# Patient Record
Sex: Female | Born: 1990 | Race: Black or African American | Hispanic: No | Marital: Single | Smoking: Never smoker
Health system: Southern US, Community
[De-identification: ages and names within clinical notes are randomized; demographics above are authoritative.]

## PROBLEM LIST (undated history)

## (undated) ENCOUNTER — Inpatient Hospital Stay (HOSPITAL_COMMUNITY): Payer: Self-pay

## (undated) DIAGNOSIS — K219 Gastro-esophageal reflux disease without esophagitis: Secondary | ICD-10-CM

## (undated) DIAGNOSIS — M549 Dorsalgia, unspecified: Secondary | ICD-10-CM

## (undated) HISTORY — PX: NO PAST SURGERIES: SHX2092

---

## 2011-07-20 ENCOUNTER — Encounter (HOSPITAL_COMMUNITY): Payer: Self-pay | Admitting: *Deleted

## 2011-07-20 ENCOUNTER — Inpatient Hospital Stay (HOSPITAL_COMMUNITY)
Admission: AD | Admit: 2011-07-20 | Discharge: 2011-07-20 | Disposition: A | Discharge status: Home / Self Care | Payer: Medicaid Other | Source: Ambulatory Visit | Attending: Obstetrics & Gynecology | Admitting: Obstetrics & Gynecology

## 2011-07-20 ENCOUNTER — Inpatient Hospital Stay (HOSPITAL_COMMUNITY): Payer: Medicaid Other

## 2011-07-20 DIAGNOSIS — Z3400 Encounter for supervision of normal first pregnancy, unspecified trimester: Secondary | ICD-10-CM

## 2011-07-20 DIAGNOSIS — O26819 Pregnancy related exhaustion and fatigue, unspecified trimester: Secondary | ICD-10-CM

## 2011-07-20 DIAGNOSIS — O26849 Uterine size-date discrepancy, unspecified trimester: Secondary | ICD-10-CM | POA: Insufficient documentation

## 2011-07-20 DIAGNOSIS — O26841 Uterine size-date discrepancy, first trimester: Secondary | ICD-10-CM

## 2011-07-20 DIAGNOSIS — IMO0002 Reserved for concepts with insufficient information to code with codable children: Secondary | ICD-10-CM | POA: Insufficient documentation

## 2011-07-20 LAB — URINALYSIS, ROUTINE W REFLEX MICROSCOPIC
Leukocytes, UA: NEGATIVE
Nitrite: NEGATIVE
Specific Gravity, Urine: 1.025 (ref 1.005–1.030)
pH: 6 (ref 5.0–8.0)

## 2011-07-20 LAB — CBC
Platelets: 214 10*3/uL (ref 150–400)
RBC: 4.01 MIL/uL (ref 3.87–5.11)
RDW: 13 % (ref 11.5–15.5)
WBC: 7.1 10*3/uL (ref 4.0–10.5)

## 2011-07-20 LAB — GLUCOSE, CAPILLARY: Glucose-Capillary: 83 mg/dL (ref 70–99)

## 2011-07-20 MED ORDER — ACETAMINOPHEN 500 MG PO TABS
1000.0000 mg | ORAL_TABLET | Freq: Once | ORAL | Status: AC
  Administered 2011-07-20: 1000 mg via ORAL
  Filled 2011-07-20: qty 2

## 2011-07-20 NOTE — MAU Note (Signed)
[employer] translator [place] used for triage. Patient state she has been feeling mild pain all over her body and is tired. Has had a positive pregnancy test at [facility]. Has an appointment with [facility] next week. Denies any bleeding or unusual discharge, abdominal pain or nausea.

## 2011-07-20 NOTE — Discharge Instructions (Signed)
Eat small frequent meals/snacks. Drink 10-12 glasses of water per day. Take naps as needed.

## 2011-07-20 NOTE — MAU Provider Note (Signed)
Cindy Traore20 y.o.G1P0 @[redacted]w[redacted]d  by LMP Chief Complaint  Patient presents with  . Fatigue     First Provider Initiated Contact with Patient 07/20/11 1812      SUBJECTIVE  HPI: Pt reports weakness, fatigue, HA, body aches x a few days. She has not tried anything for Tx. She denies fever, chills, neck stiffness, N/V/D, loss of appetite, VB, abd pain. She states she is scheduled to start [facility] next week.   Past Medical History  Diagnosis Date  . No pertinent past medical history    Past Surgical History  Procedure Date  . No past surgeries    History   Social History  . Marital Status: Married    Spouse Name: N/A    Number of Children: N/A  . Years of Education: N/A   Occupational History  . Not on file.   Social History Main Topics  . Smoking status: Never Smoker   . Smokeless tobacco: Never Used  . Alcohol Use: No  . Drug Use: No  . Sexually Active: Yes    Birth Control/ Protection: None   Other Topics Concern  . Not on file   Social History Narrative  . No narrative on file   No current facility-administered medications on file prior to encounter.   No current outpatient prescriptions on file prior to encounter.   No Known Allergies  ROS: Pertinent items in HPI  OBJECTIVE Blood pressure 110/68, pulse 79, temperature 98.8 F (37.1 C), temperature source Oral, resp. rate 16, height 5' 3.5" (1.613 m), weight 59.149 kg (130 lb 6.4 oz), last menstrual period 05/18/2011, SpO2 100.00%.  GENERAL: Well-developed, well-nourished female in no acute distress.  HEENT: Normocephalic. Mucus membranes moist. HEART: normal rate RESP: normal effort ABDOMEN: Soft, nontender EXTREMITIES: Nontender, no edema NEURO: Alert and oriented SPECULUM EXAM:deferred UTA FHR by informal BS [place].   LAB RESULTS Results for orders placed during the hospital encounter of 07/20/11 (from the past 24 hour(s))  URINALYSIS, ROUTINE W REFLEX MICROSCOPIC     Status: Normal   Collection Time   07/20/11  5:05 PM      Component Value Range   Color, Urine YELLOW  YELLOW    APPearance CLEAR  CLEAR    Specific Gravity, Urine 1.025  1.005 - 1.030    pH 6.0  5.0 - 8.0    Glucose, UA NEGATIVE  NEGATIVE (mg/dL)   Hgb urine dipstick NEGATIVE  NEGATIVE    Bilirubin Urine NEGATIVE  NEGATIVE    Ketones, ur NEGATIVE  NEGATIVE (mg/dL)   Protein, ur NEGATIVE  NEGATIVE (mg/dL)   Urobilinogen, UA 0.2  0.0 - 1.0 (mg/dL)   Nitrite NEGATIVE  NEGATIVE    Leukocytes, UA NEGATIVE  NEGATIVE   POCT PREGNANCY, URINE     Status: Abnormal   Collection Time   07/20/11  5:20 PM      Component Value Range   Preg Test, Ur POSITIVE (*) NEGATIVE   GLUCOSE, CAPILLARY     Status: Normal   Collection Time   07/20/11  6:29 PM      Component Value Range   Glucose-Capillary 83  70 - 99 (mg/dL)   Comment 1 Notify RN    CBC     Status: Normal   Collection Time   07/20/11  6:33 PM      Component Value Range   WBC 7.1  4.0 - 10.5 (K/uL)   RBC 4.01  3.87 - 5.11 (MIL/uL)   Hemoglobin 12.5  12.0 - 15.0 (g/dL)   HCT 16.1  09.6 - 04.5 (%)   MCV 92.5  78.0 - 100.0 (fL)   MCH 31.2  26.0 - 34.0 (pg)   MCHC 33.7  30.0 - 36.0 (g/dL)   RDW 40.9  81.1 - 91.4 (%)   Platelets 214  150 - 400 (K/uL)   HA resolved w/ Tylenol and PO fluids.  IMAGING 5.6 week SIUP, S<D, FHR 101.   ASSESSMENT 1. Uterine size date discrepancy pregnancy, first trimester   2. Normal pregnancy, first   3. Fatigue during pregnancy    PLAN D/C home Follow-up Information    Follow up with [facility] ob/gyn. (next week or MAU as needed if symptoms worsen)         Medication List  As of 07/20/2011  8:16 PM   CONTINUE taking these medications         prenatal multivitamin Tabs          Discussed normal fatigue of pregnancy. Encouraged naps and frequent snacks.   Dorathy Kinsman 07/20/2011 6:44 PM

## 2011-07-23 NOTE — MAU Provider Note (Signed)
Attestation of Attending Supervision of Advanced Practitioner: Evaluation and management procedures were performed by the [facility] Fellow/PA/CNM/NP under my supervision and collaboration. Chart reviewed, and agree with management and plan.  Jaynie Collins, M.D. 07/23/2011 10:59 AM

## 2011-08-05 ENCOUNTER — Encounter (HOSPITAL_COMMUNITY): Payer: Self-pay | Admitting: *Deleted

## 2011-08-05 ENCOUNTER — Inpatient Hospital Stay (EMERGENCY_DEPARTMENT_HOSPITAL)
Admission: AD | Admit: 2011-08-05 | Discharge: 2011-08-06 | Disposition: A | Discharge status: Home / Self Care | Payer: Medicaid Other | Source: Ambulatory Visit | Attending: Obstetrics and Gynecology | Admitting: Obstetrics and Gynecology

## 2011-08-05 DIAGNOSIS — K5900 Constipation, unspecified: Secondary | ICD-10-CM

## 2011-08-05 DIAGNOSIS — O210 Mild hyperemesis gravidarum: Secondary | ICD-10-CM | POA: Insufficient documentation

## 2011-08-05 DIAGNOSIS — R1013 Epigastric pain: Secondary | ICD-10-CM | POA: Insufficient documentation

## 2011-08-05 DIAGNOSIS — O99891 Other specified diseases and conditions complicating pregnancy: Secondary | ICD-10-CM | POA: Insufficient documentation

## 2011-08-05 DIAGNOSIS — K5909 Other constipation: Secondary | ICD-10-CM

## 2011-08-05 DIAGNOSIS — R12 Heartburn: Secondary | ICD-10-CM | POA: Insufficient documentation

## 2011-08-05 DIAGNOSIS — R109 Unspecified abdominal pain: Secondary | ICD-10-CM | POA: Insufficient documentation

## 2011-08-05 LAB — URINALYSIS, ROUTINE W REFLEX MICROSCOPIC
Hgb urine dipstick: NEGATIVE
Ketones, ur: 40 mg/dL — AB
Leukocytes, UA: NEGATIVE
Protein, ur: NEGATIVE mg/dL
Urobilinogen, UA: 0.2 mg/dL (ref 0.0–1.0)

## 2011-08-05 MED ORDER — FLEET ENEMA 7-19 GM/118ML RE ENEM
1.0000 | ENEMA | Freq: Once | RECTAL | Status: AC
  Administered 2011-08-05: 1 via RECTAL

## 2011-08-05 MED ORDER — POLYETHYLENE GLYCOL 3350 17 GM/SCOOP PO POWD: 17.0000 g | Freq: Every day | ORAL | Status: AC

## 2011-08-05 MED ORDER — CLOTRIMAZOLE 1 % VA CREA: 1.0000 | TOPICAL_CREAM | Freq: Every day | VAGINAL | Status: DC

## 2011-08-05 NOTE — MAU Note (Signed)
C/o soreness over her entire abdomen (mostly at night); c/o nausea frequently; c/o indigestion frequently; does not speak any [place]; has lived here only 4 months; and is here with her husband;

## 2011-08-05 NOTE — MAU Note (Signed)
Per pt's husband pt is having generalized abd pain every night x 5 nights, taking tylenol without relief. Denies bleeding. Denies dysuria. Vomiting every night. Taking pepcid for heartburn

## 2011-08-05 NOTE — MAU Provider Note (Signed)
History   CSN: [id number] Arrival date and time: 08/05/11 2228  First Provider Initiated Contact with Patient 08/05/11 2317   Chief Complaint  Patient presents with  . Abdominal Pain   Abdominal Pain This is a new problem. The current episode started in the past 7 days. The onset quality is gradual. The problem occurs constantly. The most recent episode lasted 5 days. The problem has been gradually worsening. The pain is located in the generalized abdominal region and suprapubic region. The pain is at a severity of 7/10. The pain is moderate. The quality of the pain is aching, colicky and cramping. The abdominal pain does not radiate. Associated symptoms include anorexia, headaches (occasionally, no associated symptoms, non currently) and nausea. Pertinent negatives include no arthralgias, belching, constipation (reports, daily very soft stools, no straining), diarrhea, dysuria, fever, flatus, frequency, hematochezia, hematuria, melena, myalgias, vomiting or weight loss. The pain is aggravated by nothing. The pain is relieved by nothing. She has tried H2 blockers for the symptoms. The treatment provided no relief. There is no history of abdominal surgery, colon cancer, Crohn's disease, gallstones, GERD, irritable bowel syndrome, pancreatitis, PUD or ulcerative colitis.  Patient is a 21 y.o. female presenting with abdominal pain.  Abdominal Pain The primary symptoms of the illness include abdominal pain and nausea. The primary symptoms of the illness do not include fever, vomiting, diarrhea, hematochezia or dysuria. The current episode started in the past 7 days. The onset of the illness was gradual. The problem has been gradually worsening.  The abdominal pain does not radiate.  Additional symptoms associated with the illness include anorexia. Symptoms associated with the illness do not include chills, constipation (reports, daily very soft stools, no straining), hematuria or frequency. Significant  associated medical issues do not include PUD, GERD or gallstones.    OB History    Grav Para Term Preterm Abortions TAB SAB Ect Mult Living   1               Past Medical History  Diagnosis Date  . No pertinent past medical history     Past Surgical History  Procedure Date  . No past surgeries     Family History  Problem Relation Age of Onset  . Anesthesia problems Neg Hx   . Hypotension Neg Hx   . Malignant hyperthermia Neg Hx   . Pseudochol deficiency Neg Hx    History  Substance Use Topics  . Smoking status: Never Smoker   . Smokeless tobacco: Never Used  . Alcohol Use: No   Allergies: No Known Allergies  Prescriptions prior to admission  Medication Sig Dispense Refill  . Prenatal Vit-Fe Fumarate-FA (PRENATAL MULTIVITAMIN) TABS Take 1 tablet by mouth daily.       Review of Systems  Constitutional: Negative for fever, chills and weight loss.  Eyes: Negative for blurred vision, double vision and photophobia.  Respiratory: Negative.   Cardiovascular: Negative.   Gastrointestinal: Positive for nausea, abdominal pain and anorexia. Negative for vomiting, diarrhea, constipation (reports, daily very soft stools, no straining), melena, hematochezia and flatus.  Genitourinary: Negative.  Negative for dysuria, frequency and hematuria.  Musculoskeletal: Negative for myalgias and arthralgias.  Neurological: Positive for headaches (occasionally, no associated symptoms, non currently).  Psychiatric/Behavioral: Negative.    Physical Exam   Blood pressure 117/76, pulse 76, temperature 99.2 F (37.3 C), temperature source Oral, resp. rate 16, height 5\' 3"  (1.6 m), weight 57.607 kg (127 lb), last menstrual period 05/18/2011, SpO2 100.00%.  Physical Exam  Nursing note and vitals reviewed. Constitutional: She is oriented to person, place, and time. She appears well-developed and well-nourished. No distress.  HENT:  Head: Normocephalic and atraumatic.  Eyes: Conjunctivae are  normal. Right eye exhibits no discharge. Left eye exhibits no discharge. No scleral icterus.  Neck: Neck supple.  Cardiovascular: Normal rate, regular rhythm, normal heart sounds and intact distal pulses.  Exam reveals no gallop and no friction rub.   No murmur heard. Respiratory: Effort normal and breath sounds normal. No respiratory distress. She has no wheezes. She has no rales. She exhibits no tenderness.  GI: Soft. Bowel sounds are normal. She exhibits mass (palpable stool burden). She exhibits no distension. There is tenderness. There is no rebound and no guarding.  Genitourinary:       Deferred  Musculoskeletal: Normal range of motion. She exhibits no edema and no tenderness.  Neurological: She is alert and oriented to person, place, and time. She has normal reflexes.  Skin: Skin is warm and dry. She is not diaphoretic.  Psychiatric: She has a normal mood and affect. Her behavior is normal.    MAU Course  Procedures Results for orders placed during the hospital encounter of 08/05/11 (from the past 24 hour(s))  URINALYSIS, ROUTINE W REFLEX MICROSCOPIC     Status: Abnormal   Collection Time   08/05/11 10:45 PM      Component Value Range   Color, Urine YELLOW  YELLOW    APPearance CLEAR  CLEAR    Specific Gravity, Urine 1.015  1.005 - 1.030    pH 6.0  5.0 - 8.0    Glucose, UA NEGATIVE  NEGATIVE (mg/dL)   Hgb urine dipstick NEGATIVE  NEGATIVE    Bilirubin Urine NEGATIVE  NEGATIVE    Ketones, ur 40 (*) NEGATIVE (mg/dL)   Protein, ur NEGATIVE  NEGATIVE (mg/dL)   Urobilinogen, UA 0.2  0.0 - 1.0 (mg/dL)   Nitrite NEGATIVE  NEGATIVE    Leukocytes, UA NEGATIVE  NEGATIVE   CBC     Status: Normal   Collection Time   08/06/11 12:05 AM      Component Value Range   WBC 6.2  4.0 - 10.5 (K/uL)   RBC 4.36  3.87 - 5.11 (MIL/uL)   Hemoglobin 13.6  12.0 - 15.0 (g/dL)   HCT 16.1  09.6 - 04.5 (%)   MCV 92.2  78.0 - 100.0 (fL)   MCH 31.2  26.0 - 34.0 (pg)   MCHC 33.8  30.0 - 36.0 (g/dL)   RDW  40.9  81.1 - 91.4 (%)   Platelets 191  150 - 400 (K/uL)   MDM Pt with history consistent with chronic constipation worsened by pregnancy.  No bleeding or other concerning symptoms. CBC unremarkable; no fevers, no chills  Assessment and Plan  Chronic constipation. Leading to nausea and poor po intake.  Fleets enema in MAU with expected results.  Miralax q day at home for tx and prevention as course expected to worsen throughout the remainder of pregnancy.    Case discussed with and pt seen by Kerrie Buffalo, NP.  Andrena Mews, DO [facility] Resident - PGY-1 08/06/2011 12:42 AM  Patient had good results with enema.  I have examined this patient and agree with the assessment and plan of care.

## 2011-08-06 ENCOUNTER — Inpatient Hospital Stay (HOSPITAL_COMMUNITY): Payer: Medicaid Other

## 2011-08-06 ENCOUNTER — Encounter (HOSPITAL_COMMUNITY): Payer: Self-pay | Admitting: *Deleted

## 2011-08-06 ENCOUNTER — Inpatient Hospital Stay (HOSPITAL_COMMUNITY)
Admission: AD | Admit: 2011-08-06 | Discharge: 2011-08-06 | Disposition: A | Discharge status: Home / Self Care | Payer: Medicaid Other | Source: Ambulatory Visit | Attending: Obstetrics & Gynecology | Admitting: Obstetrics & Gynecology

## 2011-08-06 DIAGNOSIS — O26891 Other specified pregnancy related conditions, first trimester: Secondary | ICD-10-CM

## 2011-08-06 DIAGNOSIS — O9989 Other specified diseases and conditions complicating pregnancy, childbirth and the puerperium: Secondary | ICD-10-CM

## 2011-08-06 DIAGNOSIS — R12 Heartburn: Secondary | ICD-10-CM

## 2011-08-06 LAB — CBC
HCT: 39.4 % (ref 36.0–46.0)
MCHC: 33.8 g/dL (ref 30.0–36.0)
MCHC: 34.3 g/dL (ref 30.0–36.0)
MCV: 91.4 fL (ref 78.0–100.0)
MCV: 92.2 fL (ref 78.0–100.0)
Platelets: 191 10*3/uL (ref 150–400)
Platelets: 213 10*3/uL (ref 150–400)
RDW: 12.6 % (ref 11.5–15.5)
RDW: 12.8 % (ref 11.5–15.5)
WBC: 6.2 10*3/uL (ref 4.0–10.5)
WBC: 6.3 10*3/uL (ref 4.0–10.5)

## 2011-08-06 LAB — URINALYSIS, ROUTINE W REFLEX MICROSCOPIC
Glucose, UA: NEGATIVE mg/dL
Hgb urine dipstick: NEGATIVE
Leukocytes, UA: NEGATIVE
Protein, ur: NEGATIVE mg/dL
Specific Gravity, Urine: 1.02 (ref 1.005–1.030)
pH: 6.5 (ref 5.0–8.0)

## 2011-08-06 LAB — COMPREHENSIVE METABOLIC PANEL
AST: 19 U/L (ref 0–37)
Albumin: 4.5 g/dL (ref 3.5–5.2)
BUN: 5 mg/dL — ABNORMAL LOW (ref 6–23)
Calcium: 9.7 mg/dL (ref 8.4–10.5)
Chloride: 99 mEq/L (ref 96–112)
Creatinine, Ser: 0.43 mg/dL — ABNORMAL LOW (ref 0.50–1.10)
Total Bilirubin: 0.9 mg/dL (ref 0.3–1.2)
Total Protein: 7.6 g/dL (ref 6.0–8.3)

## 2011-08-06 MED ORDER — PROMETHAZINE HCL 25 MG RE SUPP: 25.0000 mg | Freq: Four times a day (QID) | RECTAL | Status: DC | PRN

## 2011-08-06 MED ORDER — PROMETHAZINE HCL 25 MG RE SUPP
25.0000 mg | Freq: Once | RECTAL | Status: AC
  Administered 2011-08-06: 25 mg via RECTAL
  Filled 2011-08-06: qty 1

## 2011-08-06 MED ORDER — GI COCKTAIL ~~LOC~~
30.0000 mL | Freq: Three times a day (TID) | ORAL | Status: DC | PRN
  Administered 2011-08-06 (×2): 30 mL via ORAL
  Filled 2011-08-06 (×2): qty 30

## 2011-08-06 MED ORDER — PANTOPRAZOLE SODIUM 20 MG PO TBEC: 20.0000 mg | DELAYED_RELEASE_TABLET | Freq: Two times a day (BID) | ORAL | Status: DC

## 2011-08-06 MED ORDER — ONDANSETRON 8 MG PO TBDP
8.0000 mg | ORAL_TABLET | Freq: Once | ORAL | Status: AC
  Administered 2011-08-06: 8 mg via ORAL
  Filled 2011-08-06: qty 1

## 2011-08-06 NOTE — MAU Provider Note (Signed)
Agree with above note.  [name] 08/06/2011 6:02 AM

## 2011-08-06 NOTE — MAU Note (Signed)
Patient states she has been having upper abdominal pain since this weekend. Hurts more with eating and trying to swallow her medication. Has some vomiting off and on. Was seen last night in MAU. States pain continues.

## 2011-08-06 NOTE — MAU Provider Note (Addendum)
Cindy Traore21 y.o.G1P0 @[redacted]w[redacted]d  by LMP Chief Complaint  Patient presents with  . Abdominal Pain     First Provider Initiated Contact with Patient 08/06/11 1740      SUBJECTIVE HPI: [name] is a 21 y.o. year old G1P0 female at [redacted]w[redacted]d weeks gestation who presents to MAU reporting worsening epigastric pain, N/V w/ ~3 episodes of emesis per day. She was seen in MAU yesterday for similar complaints which were attributed to constiption. She reports worsening of Sx w/ eating. She experiences mild relief w/ pepcid, but does not have any antiemetics at home. She denies fever, chills, pain radiating to her back, VB or low abd pain.   Past Medical History  Diagnosis Date  . No pertinent past medical history    Past Surgical History  Procedure Date  . No past surgeries    History   Social History  . Marital Status: Married    Spouse Name: N/A    Number of Children: N/A  . Years of Education: N/A   Occupational History  . Not on file.   Social History Main Topics  . Smoking status: Never Smoker   . Smokeless tobacco: Never Used  . Alcohol Use: No  . Drug Use: No  . Sexually Active: Yes    Birth Control/ Protection: None   Other Topics Concern  . Not on file   Social History Narrative  . No narrative on file   Current Facility-Administered Medications on File Prior to Encounter  Medication Dose Route Frequency Provider Last Rate Last Dose  . sodium phosphate (FLEET) 7-19 GM/118ML enema 1 enema  1 enema Rectal Once Andrena Mews, DO   1 enema at 08/05/11 2358   Current Outpatient Prescriptions on File Prior to Encounter  Medication Sig Dispense Refill  . famotidine (PEPCID) 10 MG tablet Take 10 mg by mouth 2 (two) times daily as needed.      . Prenatal Vit-Fe Fumarate-FA (PRENATAL MULTIVITAMIN) TABS Take 1 tablet by mouth at bedtime.       . polyethylene glycol powder (MIRALAX) powder Take 17 g by mouth daily.  527 g  3   No Known Allergies  ROS: Pertinent items in  HPI  OBJECTIVE Blood pressure 115/70, pulse 78, temperature 99.1 F (37.3 C), temperature source Oral, resp. rate 16, last menstrual period 05/18/2011, SpO2 100.00%.  GENERAL: Well-developed, well-nourished female in no acute distress.  HEENT: Normocephalic HEART: normal rate RESP: normal effort ABDOMEN: Soft, mild epigastric tenderness. No mass EXTREMITIES: Nontender, no edema NEURO: Alert and oriented SPECULUM EXAM: Deferred FHR: 150 per informal BS [place].  LAB RESULTS Results for orders placed during the hospital encounter of 08/06/11 (from the past 24 hour(s))  LIPASE, BLOOD     Status: Normal   Collection Time   08/06/11  5:45 PM      Component Value Range   Lipase 25  11 - 59 (U/L)  AMYLASE     Status: Normal   Collection Time   08/06/11  5:45 PM      Component Value Range   Amylase 87  0 - 105 (U/L)  CBC     Status: Normal   Collection Time   08/06/11  5:45 PM      Component Value Range   WBC 6.3  4.0 - 10.5 (K/uL)   RBC 4.31  3.87 - 5.11 (MIL/uL)   Hemoglobin 13.5  12.0 - 15.0 (g/dL)   HCT 16.1  09.6 - 04.5 (%)   MCV 91.4  78.0 - 100.0 (fL)   MCH 31.3  26.0 - 34.0 (pg)   MCHC 34.3  30.0 - 36.0 (g/dL)   RDW 08.6  57.8 - 46.9 (%)   Platelets 213  150 - 400 (K/uL)  COMPREHENSIVE METABOLIC PANEL     Status: Abnormal   Collection Time   08/06/11  5:45 PM      Component Value Range   Sodium 132 (*) 135 - 145 (mEq/L)   Potassium 3.8  3.5 - 5.1 (mEq/L)   Chloride 99  96 - 112 (mEq/L)   CO2 24  19 - 32 (mEq/L)   Glucose, Bld 85  70 - 99 (mg/dL)   BUN 5 (*) 6 - 23 (mg/dL)   Creatinine, Ser 6.29 (*) 0.50 - 1.10 (mg/dL)   Calcium 9.7  8.4 - 52.8 (mg/dL)   Total Protein 7.6  6.0 - 8.3 (g/dL)   Albumin 4.5  3.5 - 5.2 (g/dL)   AST 19  0 - 37 (U/L)   ALT 11  0 - 35 (U/L)   Alkaline Phosphatase 54  39 - 117 (U/L)   Total Bilirubin 0.9  0.3 - 1.2 (mg/dL)   GFR calc non Af Amer >90  >90 (mL/min)   GFR calc Af Amer >90  >90 (mL/min)    IMAGING  Care of pt turned over to  Candelaria Celeste, MD at 2000.  [place], [place] 08/06/2011 8:15 PM   ASSESSMENT   PLAN   Assumed care.  Abdominal US normal.  Patient given Phenergan 25mg  suppository and GI cocktail with relief.    1.  Heartburn in pregnancy Patient to be sent home with Protonix and phenergan suppositories.  Patient to follow up if patient increases.  Levie Heritage, DO 08/06/2011 11:08 PM

## 2011-08-06 NOTE — Discharge Instructions (Signed)
Heartburn During Pregnancy  Heartburn is a burning sensation in the chest caused by stomach acid backing up into the esophagus. Heartburn (also known as "reflux") is common in pregnancy because a certain hormone (progesterone) changes. The progesterone hormone may relax the valve that separates the esophagus from the stomach. This allows acid to go up into the esophagus, causing heartburn. Heartburn may also happen in pregnancy because the enlarging uterus pushes up on the stomach, which pushes more acid into the esophagus. This is especially true in the later stages of pregnancy. Heartburn problems usually go away after giving birth. CAUSES   The progesterone hormone.   Changing hormone levels.   The growing uterus that pushes stomach acid upward.   Large meals.   Certain foods and drinks.   Exercise.   Increased acid production.  SYMPTOMS   Burning pain in the chest or lower throat.   Bitter taste in the mouth.   Coughing.  DIAGNOSIS  Heartburn is typically diagnosed by your caregiver when taking a careful history of your concern. Your caregiver may order a blood test to check for a certain type of bacteria that is associated with heartburn. Sometimes, heartburn is diagnosed by prescribing a heartburn medicine to see if the symptoms improve. It is rare in pregnancy to have a procedure called an endoscopy. This is when a tube with a light and a camera on the end is used to examine the esophagus and the stomach. TREATMENT   Your caregiver may tell you to use certain over-the-counter medicines (antacids, acid reducers) for mild heartburn.   Your caregiver may prescribe medicines to decrease stomach acid or to protect your stomach lining.   Your caregiver may recommend certain diet changes.   For severe cases, your caregiver may recommend that the head of the bed be elevated on blocks. (Sleeping with more pillows is not an effective treatment as it only changes the position of your  head and does not improve the main problem of stomach acid refluxing into the esophagus.)  HOME CARE INSTRUCTIONS   Take all medicines as directed by your caregiver.   Raise the head of your bed by putting blocks under the legs if instructed to by your caregiver.   Do not exercise right after eating.   Avoid eating 2 or 3 hours before bed. Do not lie down right after eating.   Eat small meals throughout the day instead of 3 large meals.   Identify foods and beverages that make your symptoms worse and avoid them. Foods you may want to avoid include:   Peppers.   Chocolate.   High-fat foods, including fried foods.   Spicy foods.   Garlic and onions.   Citrus fruits, including oranges, grapefruit, lemons, and limes.   Food containing tomatoes or tomato products.   Mint.   Carbonated and caffeinated drinks.   Vinegar.  SEEK IMMEDIATE MEDICAL CARE IF:   You have severe chest pain that goes down your arm or into your jaw or neck.   You feel sweaty, dizzy, or lightheaded.   You become short of breath.   You vomit blood.   You have difficulty or pain with swallowing.   You have bloody or black, tarry stools.   You have episodes of heartburn more than 3 times a week, for more than 2 weeks.  MAKE SURE YOU:  Understand these instructions.   Will watch your condition.   Will get help right away if you are not doing well or   get worse.  Document Released: 02/17/2000 Document Revised: 02/08/2011 Document Reviewed: 08/10/2010 [employer] Patient Information 2012 [employer]. 

## 2011-08-06 NOTE — Discharge Instructions (Signed)
Please follow up with your OB as scheduled next week.  Be sure to drink plenty of water and eat a diet high in fiber.    Constipation in Adults Constipation is having fewer than 2 bowel movements per week. Usually, the stools are hard. As we grow older, constipation is more common. If you try to fix constipation with laxatives, the problem may get worse. This is because laxatives taken over a long period of time make the colon muscles weaker. A low-fiber diet, not taking in enough fluids, and taking some medicines may make these problems worse. MEDICATIONS THAT MAY CAUSE CONSTIPATION  Water pills (diuretics).   Calcium channel blockers (used to control blood pressure and for the heart).   Certain pain medicines (narcotics).   Anticholinergics.   Anti-inflammatory agents.   Antacids that contain aluminum.  DISEASES THAT CONTRIBUTE TO CONSTIPATION  Diabetes.   Parkinson's disease.   Dementia.   Stroke.   Depression.   Illnesses that cause problems with salt and water metabolism.  HOME CARE INSTRUCTIONS   Constipation is usually best cared for without medicines. Increasing dietary fiber and eating more fruits and vegetables is the best way to manage constipation.   Slowly increase fiber intake to 25 to 38 grams per day. Whole grains, fruits, vegetables, and legumes are good sources of fiber. A dietitian can further help you incorporate high-fiber foods into your diet.   Drink enough water and fluids to keep your urine clear or pale yellow.   A fiber supplement may be added to your diet if you cannot get enough fiber from foods.   Increasing your activities also helps improve regularity.   Suppositories, as suggested by your caregiver, will also help. If you are using antacids, such as aluminum or calcium containing products, it will be helpful to switch to products containing magnesium if your caregiver says it is okay.   If you have been given a liquid injection (enema)  today, this is only a temporary measure. It should not be relied on for treatment of longstanding (chronic) constipation.   Stronger measures, such as magnesium sulfate, should be avoided if possible. This may cause uncontrollable diarrhea. Using magnesium sulfate may not allow you time to make it to the bathroom.  SEEK IMMEDIATE MEDICAL CARE IF:   There is bright red blood in the stool.   The constipation stays for more than 4 days.   There is belly (abdominal) or rectal pain.   You do not seem to be getting better.   You have any questions or concerns.  MAKE SURE YOU:   Understand these instructions.   Will watch your condition.   Will get help right away if you are not doing well or get worse.  Document Released: 11/18/2003 Document Revised: 02/08/2011 Document Reviewed: 01/23/2011 [facility] Patient Information 2012 [place], [place].

## 2011-08-06 NOTE — MAU Note (Signed)
Pt reports good results from fleets enema.

## 2011-08-06 NOTE — MAU Provider Note (Signed)
Attestation of Attending Supervision of Advanced Practitioner: Evaluation and management procedures were performed by the OB Fellow/PA/CNM/NP under my supervision and collaboration. Chart reviewed, and agree with management and plan.  Milano Rosevear, M.D. 08/06/2011 10:54 PM   

## 2011-08-15 LAB — OB RESULTS CONSOLE GC/CHLAMYDIA
Chlamydia: NEGATIVE
Gonorrhea: NEGATIVE

## 2011-08-15 LAB — OB RESULTS CONSOLE ABO/RH

## 2011-08-15 LAB — OB RESULTS CONSOLE ANTIBODY SCREEN: Antibody Screen: NEGATIVE

## 2011-11-15 LAB — OB RESULTS CONSOLE GBS: GBS: NEGATIVE

## 2012-03-05 NOTE — L&D Delivery Note (Signed)
Delivery Note Pt progressed to complete and pushed well.  At 8:21 PM a viable female was delivered via Vaginal, Spontaneous Delivery (Presentation: Right Occiput Anterior).  APGAR: 8, ; weight 6 lb 15.3 oz (3155 g).   Placenta status: Intact, Spontaneous.  Cord: 3 vessels with the following complications: None.  Anesthesia: Epidural  Episiotomy: Median Lacerations: None Suture Repair: 3.0 vicryl rapide Est. Blood Loss (mL): 400  Mom to postpartum.  Baby to stay with mom.  Manuel Lawhead D 03/13/2012, 9:21 PM

## 2012-03-13 ENCOUNTER — Inpatient Hospital Stay (HOSPITAL_COMMUNITY)
Admission: AD | Admit: 2012-03-13 | Discharge: 2012-03-13 | Disposition: A | Discharge status: Hospice | Payer: Medicaid Other | Source: Ambulatory Visit | Attending: Obstetrics and Gynecology | Admitting: Obstetrics and Gynecology

## 2012-03-13 ENCOUNTER — Encounter (HOSPITAL_COMMUNITY): Payer: Self-pay | Admitting: Anesthesiology

## 2012-03-13 ENCOUNTER — Encounter (HOSPITAL_COMMUNITY): Payer: Self-pay | Admitting: *Deleted

## 2012-03-13 ENCOUNTER — Inpatient Hospital Stay (HOSPITAL_COMMUNITY): Payer: Medicaid Other | Admitting: Anesthesiology

## 2012-03-13 ENCOUNTER — Inpatient Hospital Stay (HOSPITAL_COMMUNITY)
Admission: AD | Admit: 2012-03-13 | Discharge: 2012-03-15 | DRG: 775 | Disposition: A | Discharge status: Home / Self Care | Payer: Medicaid Other | Source: Ambulatory Visit | Attending: Obstetrics and Gynecology | Admitting: Obstetrics and Gynecology

## 2012-03-13 DIAGNOSIS — O479 False labor, unspecified: Secondary | ICD-10-CM | POA: Insufficient documentation

## 2012-03-13 DIAGNOSIS — IMO0001 Reserved for inherently not codable concepts without codable children: Secondary | ICD-10-CM

## 2012-03-13 HISTORY — DX: Gastro-esophageal reflux disease without esophagitis: K21.9

## 2012-03-13 LAB — CBC
Platelets: 183 10*3/uL (ref 150–400)
RBC: 4.07 MIL/uL (ref 3.87–5.11)
RDW: 12.4 % (ref 11.5–15.5)
WBC: 10 10*3/uL (ref 4.0–10.5)

## 2012-03-13 LAB — RPR: RPR Ser Ql: NONREACTIVE

## 2012-03-13 MED ORDER — DIPHENHYDRAMINE HCL 50 MG/ML IJ SOLN: 12.5000 mg | INTRAMUSCULAR | Status: DC | PRN

## 2012-03-13 MED ORDER — BENZOCAINE-MENTHOL 20-0.5 % EX AERO
1.0000 "application " | INHALATION_SPRAY | CUTANEOUS | Status: DC | PRN
  Administered 2012-03-13: 1 via TOPICAL
  Filled 2012-03-13: qty 56

## 2012-03-13 MED ORDER — EPHEDRINE 5 MG/ML INJ
10.0000 mg | INTRAVENOUS | Status: DC | PRN
  Filled 2012-03-13: qty 4

## 2012-03-13 MED ORDER — FENTANYL 2.5 MCG/ML BUPIVACAINE 1/10 % EPIDURAL INFUSION (WH - ANES)
14.0000 mL/h | INTRAMUSCULAR | Status: DC
  Administered 2012-03-13: 14 mL/h via EPIDURAL
  Filled 2012-03-13: qty 125

## 2012-03-13 MED ORDER — ZOLPIDEM TARTRATE 5 MG PO TABS
5.0000 mg | ORAL_TABLET | Freq: Once | ORAL | Status: AC
  Administered 2012-03-13: 5 mg via ORAL
  Filled 2012-03-13: qty 1

## 2012-03-13 MED ORDER — DIBUCAINE 1 % RE OINT: 1.0000 "application " | TOPICAL_OINTMENT | RECTAL | Status: DC | PRN

## 2012-03-13 MED ORDER — MEASLES, MUMPS & RUBELLA VAC ~~LOC~~ INJ: 0.5000 mL | INJECTION | Freq: Once | SUBCUTANEOUS | Status: DC

## 2012-03-13 MED ORDER — LIDOCAINE HCL (PF) 1 % IJ SOLN
30.0000 mL | INTRAMUSCULAR | Status: DC | PRN
  Filled 2012-03-13: qty 30

## 2012-03-13 MED ORDER — METHYLERGONOVINE MALEATE 0.2 MG/ML IJ SOLN: 0.2000 mg | INTRAMUSCULAR | Status: DC | PRN

## 2012-03-13 MED ORDER — LACTATED RINGERS IV SOLN: 500.0000 mL | INTRAVENOUS | Status: DC | PRN

## 2012-03-13 MED ORDER — CITRIC ACID-SODIUM CITRATE 334-500 MG/5ML PO SOLN: 30.0000 mL | ORAL | Status: DC | PRN

## 2012-03-13 MED ORDER — ZOLPIDEM TARTRATE 5 MG PO TABS: 5.0000 mg | ORAL_TABLET | Freq: Every evening | ORAL | Status: DC | PRN

## 2012-03-13 MED ORDER — LIDOCAINE HCL (PF) 1 % IJ SOLN
INTRAMUSCULAR | Status: DC | PRN
  Administered 2012-03-13 (×4): 4 mL

## 2012-03-13 MED ORDER — ACETAMINOPHEN 325 MG PO TABS: 650.0000 mg | ORAL_TABLET | ORAL | Status: DC | PRN

## 2012-03-13 MED ORDER — PHENYLEPHRINE 40 MCG/ML (10ML) SYRINGE FOR IV PUSH (FOR BLOOD PRESSURE SUPPORT): 80.0000 ug | PREFILLED_SYRINGE | INTRAVENOUS | Status: DC | PRN

## 2012-03-13 MED ORDER — ONDANSETRON HCL 4 MG/2ML IJ SOLN: 4.0000 mg | INTRAMUSCULAR | Status: DC | PRN

## 2012-03-13 MED ORDER — METHYLERGONOVINE MALEATE 0.2 MG PO TABS: 0.2000 mg | ORAL_TABLET | ORAL | Status: DC | PRN

## 2012-03-13 MED ORDER — LANOLIN HYDROUS EX OINT: TOPICAL_OINTMENT | CUTANEOUS | Status: DC | PRN

## 2012-03-13 MED ORDER — IBUPROFEN 600 MG PO TABS: 600.0000 mg | ORAL_TABLET | Freq: Four times a day (QID) | ORAL | Status: DC | PRN

## 2012-03-13 MED ORDER — OXYCODONE-ACETAMINOPHEN 5-325 MG PO TABS: 1.0000 | ORAL_TABLET | ORAL | Status: DC | PRN

## 2012-03-13 MED ORDER — OXYTOCIN 40 UNITS IN LACTATED RINGERS INFUSION - SIMPLE MED
62.5000 mL/h | INTRAVENOUS | Status: DC
  Filled 2012-03-13: qty 1000

## 2012-03-13 MED ORDER — LACTATED RINGERS IV SOLN
INTRAVENOUS | Status: DC
  Administered 2012-03-13 (×2): via INTRAVENOUS

## 2012-03-13 MED ORDER — WITCH HAZEL-GLYCERIN EX PADS: 1.0000 "application " | MEDICATED_PAD | CUTANEOUS | Status: DC | PRN

## 2012-03-13 MED ORDER — TETANUS-DIPHTH-ACELL PERTUSSIS 5-2.5-18.5 LF-MCG/0.5 IM SUSP: 0.5000 mL | Freq: Once | INTRAMUSCULAR | Status: DC

## 2012-03-13 MED ORDER — PHENYLEPHRINE 40 MCG/ML (10ML) SYRINGE FOR IV PUSH (FOR BLOOD PRESSURE SUPPORT)
80.0000 ug | PREFILLED_SYRINGE | INTRAVENOUS | Status: DC | PRN
  Filled 2012-03-13: qty 5

## 2012-03-13 MED ORDER — BUTORPHANOL TARTRATE 1 MG/ML IJ SOLN: 1.0000 mg | INTRAMUSCULAR | Status: DC | PRN

## 2012-03-13 MED ORDER — DIPHENHYDRAMINE HCL 25 MG PO CAPS: 25.0000 mg | ORAL_CAPSULE | Freq: Four times a day (QID) | ORAL | Status: DC | PRN

## 2012-03-13 MED ORDER — OXYTOCIN BOLUS FROM INFUSION
500.0000 mL | INTRAVENOUS | Status: DC
  Administered 2012-03-13: 500 mL via INTRAVENOUS

## 2012-03-13 MED ORDER — ONDANSETRON HCL 4 MG PO TABS: 4.0000 mg | ORAL_TABLET | ORAL | Status: DC | PRN

## 2012-03-13 MED ORDER — LACTATED RINGERS IV SOLN: 500.0000 mL | Freq: Once | INTRAVENOUS | Status: DC

## 2012-03-13 MED ORDER — MAGNESIUM HYDROXIDE 400 MG/5ML PO SUSP: 30.0000 mL | ORAL | Status: DC | PRN

## 2012-03-13 MED ORDER — SIMETHICONE 80 MG PO CHEW: 80.0000 mg | CHEWABLE_TABLET | ORAL | Status: DC | PRN

## 2012-03-13 MED ORDER — IBUPROFEN 600 MG PO TABS
600.0000 mg | ORAL_TABLET | Freq: Four times a day (QID) | ORAL | Status: DC
  Administered 2012-03-13 – 2012-03-15 (×7): 600 mg via ORAL
  Filled 2012-03-13 (×6): qty 1

## 2012-03-13 MED ORDER — PRENATAL MULTIVITAMIN CH
1.0000 | ORAL_TABLET | Freq: Every day | ORAL | Status: DC
  Administered 2012-03-14 – 2012-03-15 (×2): 1 via ORAL
  Filled 2012-03-13 (×2): qty 1

## 2012-03-13 MED ORDER — EPHEDRINE 5 MG/ML INJ: 10.0000 mg | INTRAVENOUS | Status: DC | PRN

## 2012-03-13 MED ORDER — ONDANSETRON HCL 4 MG/2ML IJ SOLN: 4.0000 mg | Freq: Four times a day (QID) | INTRAMUSCULAR | Status: DC | PRN

## 2012-03-13 MED ORDER — SENNOSIDES-DOCUSATE SODIUM 8.6-50 MG PO TABS
2.0000 | ORAL_TABLET | Freq: Every day | ORAL | Status: DC
  Administered 2012-03-15: 2 via ORAL

## 2012-03-13 NOTE — MAU Note (Signed)
Pt states U/C got more intense since leaving 1100 this am from MAU and are every 5 minutes.  FOB states pt took tylenol pm without relief.  Pt. Denies vaginal bleeding or ROM.  Good fetal movement.

## 2012-03-13 NOTE — H&P (Signed)
[name] is a 22 y.o. female, G1 P0, EGA 39+ weeks with EDC 1-11 presenting for evaluation of ctx.  She was in MAU early this morning with ctx, no significant change in cervix.  She went home, cam back 3 hours later with stronger ctx, VE 4 cm.  Prenatal care essentially uncomplicated, see prenatal records for complete history.  Maternal Medical History:  Reason for admission: Reason for admission: contractions.  Contractions: Frequency: regular.   Perceived severity is strong.    Fetal activity: Perceived fetal activity is normal.    Prenatal complications: no prenatal complications   OB History    Grav Para Term Preterm Abortions TAB SAB Ect Mult Living   1 0 0 0 0 0 0 0 0 0      Past Medical History  Diagnosis Date  . GERD (gastroesophageal reflux disease)    Past Surgical History  Procedure Date  . No past surgeries    Family History: family history is negative for Anesthesia problems, and Hypotension, and Malignant hyperthermia, and Pseudochol deficiency, and Other, . Social History:  reports that she has never smoked. She has never used smokeless tobacco. She reports that she does not drink alcohol or use illicit drugs.   Prenatal Transfer Tool  Maternal Diabetes: No Genetic Screening: Normal Maternal Ultrasounds/Referrals: Normal Fetal Ultrasounds or other Referrals:  None Maternal Substance Abuse:  No Significant Maternal Medications:  None Significant Maternal Lab Results:  Lab values include: Group B Strep negative Other Comments:  None  Review of Systems  Respiratory: Negative.   Cardiovascular: Negative.    AROM clear Dilation: 7 Effacement (%): 90 Station: 0 Exam by:: Dr. Jackelyn Knife Blood pressure 106/71, pulse 94, temperature 97.8 F (36.6 C), temperature source Oral, resp. rate 18, height 5\' 3"  (1.6 m), weight 67.132 kg (148 lb), last menstrual period 05/18/2011. Maternal Exam:  Uterine Assessment: Contraction strength is moderate.  Contraction  frequency is regular.   Abdomen: Patient reports no abdominal tenderness. Estimated fetal weight is 7 lbs.   Fetal presentation: vertex  Introitus: Normal vulva. Normal vagina.  Amniotic fluid character: clear.  Pelvis: adequate for delivery.   Cervix: Cervix evaluated by digital exam.     Fetal Exam Fetal Monitor Review: Mode: ultrasound.   Baseline rate: 120.  Variability: moderate (6-25 bpm).   Pattern: accelerations present and no decelerations.    Fetal State Assessment: Category I - tracings are normal.     Physical Exam  Constitutional: She appears well-developed and well-nourished.  Cardiovascular: Normal rate, regular rhythm and normal heart sounds.   No murmur heard. Respiratory: Breath sounds normal. No respiratory distress. She has no wheezes.  GI: Soft.       Gravid     Prenatal labs: ABO, Rh: A/Positive/-- (06/12 0000) Antibody: Negative (06/12 0000) Rubella: Immune (06/12 0000) RPR: NON REACTIVE (01/09 1429)  HBsAg: Negative (06/12 0000)  HIV: Non-reactive (06/12 0000)  GBS: Negative (09/12 0000)  GCT:  Nl  Assessment/Plan: IUP at 39+ weeks in active labor, comfortable with epidural.  Monitor progress, anticipate SVD.   Hayes Rehfeldt D 03/13/2012, 5:15 PM

## 2012-03-13 NOTE — MAU Note (Signed)
Contractions started at 0400, no bleeding or leaking.  Had had a small amt of mucous d/c.  No problems with preg.

## 2012-03-13 NOTE — MAU Note (Signed)
Patient states she is having contractions every 15-20 minutes.

## 2012-03-13 NOTE — Anesthesia Preprocedure Evaluation (Signed)
Anesthesia Evaluation  Patient identified by MRN, date of birth, ID band Patient awake    Reviewed: Allergy & Precautions, H&P , NPO status , Patient's Chart, lab work & pertinent test results, reviewed documented beta blocker date and time   History of Anesthesia Complications Negative for: history of anesthetic complications  Airway Mallampati: I TM Distance: >3 FB Neck ROM: full    Dental  (+) Teeth Intact   Pulmonary neg pulmonary ROS,  breath sounds clear to auscultation        Cardiovascular negative cardio ROS  Rhythm:regular Rate:Normal     Neuro/Psych negative neurological ROS  negative psych ROS   GI/Hepatic Neg liver ROS, GERD-  ,  Endo/Other  negative endocrine ROS  Renal/GU negative Renal ROS     Musculoskeletal   Abdominal   Peds  Hematology negative hematology ROS (+)   Anesthesia Other Findings   Reproductive/Obstetrics (+) Pregnancy                           Anesthesia Physical Anesthesia Plan  ASA: II  Anesthesia Plan: Epidural   Post-op Pain Management:    Induction:   Airway Management Planned:   Additional Equipment:   Intra-op Plan:   Post-operative Plan:   Informed Consent: I have reviewed the patients History and Physical, chart, labs and discussed the procedure including the risks, benefits and alternatives for the proposed anesthesia with the patient or authorized representative who has indicated his/her understanding and acceptance.     Plan Discussed with:   Anesthesia Plan Comments:         Anesthesia Quick Evaluation

## 2012-03-13 NOTE — Anesthesia Procedure Notes (Signed)
Epidural Patient location during procedure: OB Start time: 03/13/2012 3:32 PM  Staffing Performed by: anesthesiologist   Preanesthetic Checklist Completed: patient identified, site marked, surgical consent, pre-op evaluation, timeout performed, IV checked, risks and benefits discussed and monitors and equipment checked  Epidural Patient position: sitting Prep: site prepped and draped and DuraPrep Patient monitoring: continuous pulse ox and blood pressure Approach: midline Injection technique: LOR air  Needle:  Needle type: Tuohy  Needle gauge: 17 G Needle length: 9 cm and 9 Needle insertion depth: 4 cm Catheter type: closed end flexible Catheter size: 19 Gauge Catheter at skin depth: 9 cm Test dose: negative  Assessment Events: blood not aspirated, injection not painful, no injection resistance, negative IV test and no paresthesia  Additional Notes Discussed risk of headache, infection, bleeding, nerve injury and failed or incomplete block.  Patient voices understanding and wishes to proceed.  Epidural placed easily on first attempt.  Patient tolerated procedure well with no apparent complications.  Jasmine December, MDReason for block:procedure for pain

## 2012-03-14 NOTE — Anesthesia Postprocedure Evaluation (Signed)
  Anesthesia Post-op Note  Patient: [name]  Procedure(s) Performed: * No procedures listed *  Patient Location: Mother/Baby  Anesthesia Type:Epidural  Level of Consciousness: awake, alert  and oriented  Airway and Oxygen Therapy: Patient Spontanous Breathing  Post-op Pain: mild  Post-op Assessment: Post-op Vital signs reviewed, Patient's Cardiovascular Status Stable, No headache, No backache, No residual numbness and No residual motor weakness  Post-op Vital Signs: Reviewed and stable  Complications: No apparent anesthesia complications

## 2012-03-14 NOTE — Progress Notes (Signed)
Post Partum Day 1 Subjective: no complaints, up ad lib and tolerating PO  Objective: Blood pressure 108/74, pulse 67, temperature 98.4 F (36.9 C), temperature source Oral, resp. rate 20, height 5\' 3"  (1.6 m), weight 67.132 kg (148 lb), last menstrual period 05/18/2011, unknown if currently breastfeeding.  Physical Exam:  General: alert and cooperative Lochia: appropriate Uterine Fundus: firm    Basename 03/13/12 1429  HGB 13.4  HCT 38.4    Assessment/Plan: Plan for discharge tomorrow   LOS: 1 day   Josiel Gahm W 03/14/2012, 8:38 AM

## 2012-03-14 NOTE — Progress Notes (Signed)
UR chart review completed.  

## 2012-03-15 MED ORDER — OXYCODONE-ACETAMINOPHEN 5-325 MG PO TABS: 1.0000 | ORAL_TABLET | Freq: Four times a day (QID) | ORAL | Status: DC | PRN

## 2012-03-15 MED ORDER — IBUPROFEN 600 MG PO TABS: 600.0000 mg | ORAL_TABLET | Freq: Four times a day (QID) | ORAL | Status: DC | PRN

## 2012-03-15 NOTE — Discharge Summary (Signed)
[name], SCHLACHTER                  ACCOUNT NO.:  [id number]  MEDICAL RECORD NO.:  [id number]  LOCATION:  9118                          FACILITY:  [facility]  PHYSICIAN:  Malachi Pro. Ambrose Mantle, M.D. DATE OF BIRTH:  May 25, 1990  DATE OF ADMISSION:  03/13/2012 DATE OF DISCHARGE:  03/15/2012                              DISCHARGE SUMMARY   This is a 23 year old black female, para 0, gravida 1, EDC March 15, 2012, presented for evaluation of contractions.  She came to the maternity admission unit earlier in the day, and was sent home because of note cervical change.  She came back 3 hours later, with stronger contractions.  Cervix was 4 cm.  Prenatal care was essentially uncomplicated.  Blood group and type A positive, negative antibody, rubella immune, RPR nonreactive, hepatitis B surface antigen negative, HIV negative, group B strep negative.  One hour Glucola was normal. After admission to the hospital, the patient progressed to complete dilatation and pushed well.  At 8:21 p.m., a living female infant delivered vaginally by Dr. Jackelyn Knife, ROA.  Weight was 6 pounds, 15.3 ounces.  Apgar was 8 at 1 minute.  Anesthesia was epidural.  Episiotomy midline.  No lacerations.  Midline episiotomy repaired with 3-0 Vicryl, repeat.  Blood loss about 400 mL.  Postpartum, the patient did quite well and was discharged on the second postpartum day.  Initial hemoglobin was 13.4, hematocrit 38.4, white count 10000, platelet count 183,000.  RPR was nonreactive.  FINAL DIAGNOSIS:  Intrauterine pregnancy, 39 weeks, delivered right occiput anterior.  OPERATION:  Spontaneous delivery ROA, repair of midline episiotomy.  FINAL CONDITION:  Improved.  INSTRUCTIONS:  Include the after visit summary, the discharge instruction booklet, prescriptions for Motrin 600 mg 30 tablets, 1 every 6 hours as needed for pain; and Percocet 5/325, 30 tablets, 1 every 6 hours as needed for pain.  She is asked to return to the office  in 6 weeks for followup examination.     Malachi Pro. Ambrose Mantle, M.D.     TFH/MEDQ  D:  03/15/2012  T:  03/15/2012  Job:  [id number]

## 2012-03-15 NOTE — Progress Notes (Signed)
Patient ID: [name], female   DOB: 1990/05/11, 21 y.o.   MRN: [id number] #2 afebrile BP normal ready for d/c

## 2012-03-24 ENCOUNTER — Inpatient Hospital Stay (HOSPITAL_COMMUNITY): Admission: RE | Admit: 2012-03-24 | Payer: Medicaid Other | Source: Ambulatory Visit

## 2012-03-29 ENCOUNTER — Encounter (HOSPITAL_COMMUNITY): Payer: Self-pay | Admitting: Emergency Medicine

## 2012-03-29 ENCOUNTER — Emergency Department (HOSPITAL_COMMUNITY)
Admission: EM | Admit: 2012-03-29 | Discharge: 2012-03-29 | Disposition: A | Discharge status: Home / Self Care | Payer: Medicaid Other | Attending: Emergency Medicine | Admitting: Emergency Medicine

## 2012-03-29 DIAGNOSIS — K219 Gastro-esophageal reflux disease without esophagitis: Secondary | ICD-10-CM | POA: Insufficient documentation

## 2012-03-29 DIAGNOSIS — N61 Mastitis without abscess: Secondary | ICD-10-CM | POA: Insufficient documentation

## 2012-03-29 DIAGNOSIS — Z79899 Other long term (current) drug therapy: Secondary | ICD-10-CM | POA: Insufficient documentation

## 2012-03-29 MED ORDER — CEPHALEXIN 500 MG PO CAPS: 500.0000 mg | ORAL_CAPSULE | Freq: Four times a day (QID) | ORAL | Status: DC

## 2012-03-29 NOTE — ED Notes (Signed)
Pt unable to nurse baby this am. Breasts are firm. Right breast warm and slightly red.

## 2012-03-29 NOTE — ED Notes (Signed)
Husband stated, she's been breast feeding and started having breast pain last night and Tylenol does not work.

## 2012-03-29 NOTE — ED Provider Notes (Signed)
History     CSN: [id number]  Arrival date & time 03/29/12  1007   First MD Initiated Contact with Patient 03/29/12 1012      Chief Complaint  Patient presents with  . Breast Pain    (Consider location/radiation/quality/duration/timing/severity/associated sxs/prior treatment) HPI Comments: Patient who is currently breast feeding developed tenderness and edema of the lateral right breast yesterday, which is gradually worsening.  She has also noticed slight erythema.  She has been taking Ibuprofen and Percocet for her pain, but does not feel that it is helping.  She does not have known fever, however she has been taking Tylenol regularly.  She is currently breast feeding.  She reports that she uses both breast for breast feeding.  She reports that she has been having a normal amount of milk from both of her breasts.  She denies any masses of her breast.  No abnormal nipple discharge.  She denies any other symptoms.    The history is provided by the patient.    Past Medical History  Diagnosis Date  . GERD (gastroesophageal reflux disease)     Past Surgical History  Procedure Date  . No past surgeries     Family History  Problem Relation Age of Onset  . Anesthesia problems Neg Hx   . Hypotension Neg Hx   . Malignant hyperthermia Neg Hx   . Pseudochol deficiency Neg Hx   . Other Neg Hx     History  Substance Use Topics  . Smoking status: Never Smoker   . Smokeless tobacco: Never Used  . Alcohol Use: No    OB History    Grav Para Term Preterm Abortions TAB SAB Ect Mult Living   1 1 1  0 0 0 0 0 0 1      Review of Systems  Constitutional: Negative for fever and chills.  Gastrointestinal: Negative for nausea and vomiting.  Skin:       Right breast swelling and tenderness  All other systems reviewed and are negative.    Allergies  Review of patient's allergies indicates no known allergies.  Home Medications   Current Outpatient Rx  Name  Route  Sig  Dispense   Refill  . FAMOTIDINE 10 MG PO TABS   Oral   Take 10 mg by mouth 2 (two) times daily as needed. For heartburn         . IBUPROFEN 600 MG PO TABS   Oral   Take 600 mg by mouth every 6 (six) hours as needed. For pain.         . OXYCODONE-ACETAMINOPHEN 5-325 MG PO TABS   Oral   Take 1-2 tablets by mouth every 6 (six) hours as needed. For pain.         Marland Kitchen PRENATAL MULTIVITAMIN CH   Oral   Take 1 tablet by mouth at bedtime.            BP 132/81  Pulse 103  Temp 98.4 F (36.9 C) (Oral)  Resp 16  SpO2 98%  Physical Exam  Nursing note and vitals reviewed. Constitutional: She appears well-developed and well-nourished.       Uncomfortable appearing  HENT:  Head: Normocephalic and atraumatic.  Mouth/Throat: Oropharynx is clear and moist.  Neck: Normal range of motion. Neck supple.  Cardiovascular: Normal rate, regular rhythm and normal heart sounds.   Pulmonary/Chest: Effort normal and breath sounds normal. Right breast exhibits skin change and tenderness. Right breast exhibits no inverted nipple, no mass  and no nipple discharge. Left breast exhibits no inverted nipple, no mass, no nipple discharge, no skin change and no tenderness.         Patient with edema, hardness, and tenderness of the lateral right breast.  No warmth of the skin.    No edema, hardness, or tenderness of the left breast.  Neurological: She is alert.  Skin: Skin is warm and dry. She is not diaphoretic.  Psychiatric: She has a normal mood and affect.    ED Course  Procedures (including critical care time)  Labs Reviewed - No data to display No results found.   1. Mastitis       MDM  Patient who is currently breast feeding presents today with edema and tenderness of the lateral portion of her right breast.  Possible Mastitis.  However, patient is afebrile.   Patient has been regularly taking Tylenol, which could be the reason that she does not have a fever at this time.  Will treat for Mastitis  with antibiotic therapy.  Patient instructed to follow up with OB/GYN in the next few days.         Pascal Lux [place], PA-C 03/30/12 [phone]

## 2012-03-29 NOTE — ED Notes (Signed)
Pt breast feeding her 57 week old infant. Started last pm with breast pain, right > left. States the pain is on "the inside" not the nipple. Also c/o headache.

## 2012-03-30 NOTE — ED Provider Notes (Signed)
Medical screening examination/treatment/procedure(s) were performed by non-physician practitioner and as supervising physician I was immediately available for consultation/collaboration.  Raeford Razor, MD 03/30/12 [phone]

## 2012-07-11 ENCOUNTER — Encounter (HOSPITAL_COMMUNITY): Payer: Self-pay

## 2012-07-11 ENCOUNTER — Emergency Department (HOSPITAL_COMMUNITY)
Admission: EM | Admit: 2012-07-11 | Discharge: 2012-07-11 | Disposition: A | Discharge status: Home / Self Care | Payer: Self-pay | Attending: Emergency Medicine | Admitting: Emergency Medicine

## 2012-07-11 DIAGNOSIS — R059 Cough, unspecified: Secondary | ICD-10-CM | POA: Insufficient documentation

## 2012-07-11 DIAGNOSIS — R05 Cough: Secondary | ICD-10-CM | POA: Insufficient documentation

## 2012-07-11 DIAGNOSIS — L299 Pruritus, unspecified: Secondary | ICD-10-CM | POA: Insufficient documentation

## 2012-07-11 DIAGNOSIS — Z8719 Personal history of other diseases of the digestive system: Secondary | ICD-10-CM | POA: Insufficient documentation

## 2012-07-11 DIAGNOSIS — Z9109 Other allergy status, other than to drugs and biological substances: Secondary | ICD-10-CM

## 2012-07-11 MED ORDER — CETIRIZINE HCL 10 MG PO TABS: 10.0000 mg | ORAL_TABLET | Freq: Every day | ORAL | Status: DC

## 2012-07-11 NOTE — ED Notes (Signed)
Pt c/o cough and nasal congestion for 2 weeks. Cough is non productive. Denies fever/chills/n/v/d. NAD noted. No SOB. Lungs clear to auscultation.

## 2012-07-11 NOTE — ED Provider Notes (Signed)
History     CSN: [id number]  Arrival date & time 07/11/12  1955   First MD Initiated Contact with Patient 07/11/12 2049      Chief Complaint  Patient presents with  . Cough    (Consider location/radiation/quality/duration/timing/severity/associated sxs/prior treatment) HPI Comments: Patient reports, that she has an itchy feeling inside of her chest and throat.  That makes her cough has been persistent and intermittent for the past, month.  She has no previous history of seasonal, allergies.  She's lived in [place] for the past 2, years and had no problem.  This time.  Last year she's been taking over-the-counter Mucinex with no relief  Patient is a 22 y.o. female presenting with cough. The history is provided by the patient.  Cough Cough characteristics:  Non-productive Severity:  Mild Timing:  Intermittent Chronicity:  New Relieved by:  Nothing Ineffective treatments:  Cough suppressants Associated symptoms: no chills, no fever, no rash, no rhinorrhea, no shortness of breath, no sore throat and no wheezing     Past Medical History  Diagnosis Date  . GERD (gastroesophageal reflux disease)     Past Surgical History  Procedure Laterality Date  . No past surgeries      Family History  Problem Relation Age of Onset  . Anesthesia problems Neg Hx   . Hypotension Neg Hx   . Malignant hyperthermia Neg Hx   . Pseudochol deficiency Neg Hx   . Other Neg Hx     History  Substance Use Topics  . Smoking status: Never Smoker   . Smokeless tobacco: Never Used  . Alcohol Use: No    OB History   Grav Para Term Preterm Abortions TAB SAB Ect Mult Living   1 1 1  0 0 0 0 0 0 1      Review of Systems  Constitutional: Negative for fever and chills.  HENT: Negative for sore throat, rhinorrhea, trouble swallowing and postnasal drip.   Respiratory: Positive for cough. Negative for chest tightness, shortness of breath, wheezing and stridor.   Skin: Negative for rash.  All other  systems reviewed and are negative.    Allergies  Review of patient's allergies indicates no known allergies.  Home Medications   Current Outpatient Rx  Name  Route  Sig  Dispense  Refill  . guaiFENesin (MUCINEX) 600 MG 12 hr tablet   Oral   Take 600 mg by mouth 2 (two) times daily as needed for congestion. For chest congestion/cough         . cetirizine (ZYRTEC ALLERGY) 10 MG tablet   Oral   Take 1 tablet (10 mg total) by mouth daily.   30 tablet   0     BP 109/59  Pulse 73  Temp(Src) 98.4 F (36.9 C) (Oral)  Resp 18  SpO2 98%  Breastfeeding? Yes  Physical Exam  Vitals reviewed. Constitutional: She is oriented to person, place, and time. She appears well-developed and well-nourished.  HENT:  Head: Normocephalic and atraumatic.  Eyes: Pupils are equal, round, and reactive to light.  Neck: Normal range of motion.  Cardiovascular: Normal rate and regular rhythm.   Pulmonary/Chest: Effort normal.  Lymphadenopathy:    She has no cervical adenopathy.  Neurological: She is oriented to person, place, and time.  Skin: Skin is warm and dry. No rash noted.    ED Course  Procedures (including critical care time)  Labs Reviewed - No data to display No results found.   1. Allergy to environmental  factors       MDM   This is most likely, seasonal allergies.  Patient is also 4 months postpartum, she is lactating Zyrtec has been prescribed, which is safe in pregnancy        Arman Filter, NP 07/11/12 2123  Medical screening examination/treatment/procedure(s) were performed by non-physician practitioner and as supervising physician I was immediately available for consultation/collaboration.  Derwood Kaplan, MD 07/15/12 [phone]

## 2012-07-11 NOTE — ED Notes (Signed)
Pt c/o dry cough and feeling "itchy" on the inside of her throat, back and chest when she coughs x2 weeks. Pt has been taking OTC mucinex with no relief

## 2013-03-11 ENCOUNTER — Encounter (HOSPITAL_COMMUNITY): Payer: Self-pay | Admitting: Emergency Medicine

## 2013-03-11 ENCOUNTER — Emergency Department (HOSPITAL_COMMUNITY)
Admission: EM | Admit: 2013-03-11 | Discharge: 2013-03-11 | Disposition: A | Discharge status: Home / Self Care | Payer: BC Managed Care – PPO | Attending: Emergency Medicine | Admitting: Emergency Medicine

## 2013-03-11 DIAGNOSIS — Z79899 Other long term (current) drug therapy: Secondary | ICD-10-CM | POA: Insufficient documentation

## 2013-03-11 DIAGNOSIS — Z8719 Personal history of other diseases of the digestive system: Secondary | ICD-10-CM | POA: Insufficient documentation

## 2013-03-11 DIAGNOSIS — L309 Dermatitis, unspecified: Secondary | ICD-10-CM

## 2013-03-11 DIAGNOSIS — L259 Unspecified contact dermatitis, unspecified cause: Secondary | ICD-10-CM | POA: Insufficient documentation

## 2013-03-11 DIAGNOSIS — R110 Nausea: Secondary | ICD-10-CM | POA: Insufficient documentation

## 2013-03-11 DIAGNOSIS — R109 Unspecified abdominal pain: Secondary | ICD-10-CM | POA: Insufficient documentation

## 2013-03-11 DIAGNOSIS — R102 Pelvic and perineal pain: Secondary | ICD-10-CM

## 2013-03-11 DIAGNOSIS — Z3202 Encounter for pregnancy test, result negative: Secondary | ICD-10-CM | POA: Insufficient documentation

## 2013-03-11 DIAGNOSIS — N949 Unspecified condition associated with female genital organs and menstrual cycle: Secondary | ICD-10-CM | POA: Insufficient documentation

## 2013-03-11 LAB — URINALYSIS, ROUTINE W REFLEX MICROSCOPIC
BILIRUBIN URINE: NEGATIVE
Glucose, UA: NEGATIVE mg/dL
HGB URINE DIPSTICK: NEGATIVE
Ketones, ur: 15 mg/dL — AB
Nitrite: NEGATIVE
Protein, ur: NEGATIVE mg/dL
SPECIFIC GRAVITY, URINE: 1.027 (ref 1.005–1.030)
UROBILINOGEN UA: 1 mg/dL (ref 0.0–1.0)
pH: 6 (ref 5.0–8.0)

## 2013-03-11 LAB — CBC WITH DIFFERENTIAL/PLATELET
BASOS PCT: 0 % (ref 0–1)
Basophils Absolute: 0 10*3/uL (ref 0.0–0.1)
EOS ABS: 0.3 10*3/uL (ref 0.0–0.7)
EOS PCT: 5 % (ref 0–5)
HEMATOCRIT: 40.8 % (ref 36.0–46.0)
HEMOGLOBIN: 14.1 g/dL (ref 12.0–15.0)
Lymphocytes Relative: 41 % (ref 12–46)
Lymphs Abs: 2.2 10*3/uL (ref 0.7–4.0)
MCH: 31.8 pg (ref 26.0–34.0)
MCHC: 34.6 g/dL (ref 30.0–36.0)
MCV: 92.1 fL (ref 78.0–100.0)
MONO ABS: 0.7 10*3/uL (ref 0.1–1.0)
MONOS PCT: 13 % — AB (ref 3–12)
NEUTROS ABS: 2.2 10*3/uL (ref 1.7–7.7)
Neutrophils Relative %: 41 % — ABNORMAL LOW (ref 43–77)
Platelets: 238 10*3/uL (ref 150–400)
RBC: 4.43 MIL/uL (ref 3.87–5.11)
RDW: 12.5 % (ref 11.5–15.5)
WBC: 5.4 10*3/uL (ref 4.0–10.5)

## 2013-03-11 LAB — COMPREHENSIVE METABOLIC PANEL
ALBUMIN: 4.2 g/dL (ref 3.5–5.2)
ALT: 12 U/L (ref 0–35)
AST: 17 U/L (ref 0–37)
Alkaline Phosphatase: 87 U/L (ref 39–117)
BILIRUBIN TOTAL: 0.7 mg/dL (ref 0.3–1.2)
BUN: 9 mg/dL (ref 6–23)
CHLORIDE: 103 meq/L (ref 96–112)
CO2: 27 mEq/L (ref 19–32)
CREATININE: 0.77 mg/dL (ref 0.50–1.10)
Calcium: 9.5 mg/dL (ref 8.4–10.5)
GFR calc Af Amer: >90 mL/min (ref >90)
GFR calc non Af Amer: >90 mL/min (ref >90)
Glucose, Bld: 78 mg/dL (ref 70–99)
POTASSIUM: 4.6 meq/L (ref 3.7–5.3)
Sodium: 141 mEq/L (ref 137–147)
TOTAL PROTEIN: 7.6 g/dL (ref 6.0–8.3)

## 2013-03-11 LAB — WET PREP, GENITAL
Clue Cells Wet Prep HPF POC: NONE SEEN
Trich, Wet Prep: NONE SEEN
Yeast Wet Prep HPF POC: NONE SEEN

## 2013-03-11 LAB — URINE MICROSCOPIC-ADD ON

## 2013-03-11 LAB — PREGNANCY, URINE: PREG TEST UR: NEGATIVE

## 2013-03-11 MED ORDER — DIPHENHYDRAMINE HCL 25 MG PO CAPS
25.0000 mg | ORAL_CAPSULE | Freq: Once | ORAL | Status: AC
  Administered 2013-03-11: 25 mg via ORAL
  Filled 2013-03-11: qty 1

## 2013-03-11 MED ORDER — HYDROCORTISONE 1 % EX CREA: TOPICAL_CREAM | CUTANEOUS | Status: DC

## 2013-03-11 NOTE — ED Provider Notes (Signed)
CSN: [id number]     Arrival date & time 03/11/13  1711 History  This chart was scribed for non-physician practitioner Coral Ceo, PA-C, working with Shelda Jakes, MD by Dorothey Baseman, ED Scribe. This patient was seen in room TR05C/TR05C and the patient's care was started at 5:54 PM.    Chief Complaint  Patient presents with  . Pruritis   The history is provided by the patient and the spouse. The history is limited by a language barrier. A language interpreter was used.   HPI Comments: [name] is a 23 y.o. female with a PMH of GERD who presents to the Emergency Department complaining of an itching, burning pain to the legs throughout, which started yesterday. She states that there is no visible rash, but that it feels like there is one "inside."  She describes and itching burning pain.  No one else in the home has a similar rash.  No recent detergent or soap changes.  She denies any recent travel.  No similar rash/sensation in the past. She reports taking an OTC antihistamine medication (contains cetrizine HCl) at home without relief of her symptoms. She has not applied any creams or ointments to her rash. She also reports an associated, intermittent, mild pain to the suprapubic region of the abdomen with associated nausea that has been ongoing for about a year after she reports that she had a Emplanon placed with no acute changes. She denies fever, chills, dysuria, vaginal discharge, vaginal bleeding, emesis, diarrhea, or constipation.  She has otherwise been well with no fevers, chills, cough, rhinorrhea, headache, dizziness, or lightheadedness.      Past Medical History  Diagnosis Date  . GERD (gastroesophageal reflux disease)    Past Surgical History  Procedure Laterality Date  . No past surgeries     Family History  Problem Relation Age of Onset  . Anesthesia problems Neg Hx   . Hypotension Neg Hx   . Malignant hyperthermia Neg Hx   . Pseudochol deficiency Neg Hx   . Other Neg  Hx    History  Substance Use Topics  . Smoking status: Never Smoker   . Smokeless tobacco: Never Used  . Alcohol Use: No   OB History   Grav Para Term Preterm Abortions TAB SAB Ect Mult Living   1 1 1  0 0 0 0 0 0 1     Review of Systems  Constitutional: Negative for fever and chills.  Gastrointestinal: Positive for nausea and abdominal pain. Negative for vomiting.  Genitourinary: Negative for dysuria, vaginal bleeding and vaginal discharge.  Musculoskeletal: Negative for arthralgias and myalgias.  Skin: Positive for rash. Negative for color change and wound.  All other systems reviewed and are negative.   Allergies  Review of patient's allergies indicates no known allergies.  Home Medications   Current Outpatient Rx  Name  Route  Sig  Dispense  Refill  . cetirizine (ZYRTEC ALLERGY) 10 MG tablet   Oral   Take 1 tablet (10 mg total) by mouth daily.   30 tablet   0   . guaiFENesin (MUCINEX) 600 MG 12 hr tablet   Oral   Take 600 mg by mouth 2 (two) times daily as needed for congestion. For chest congestion/cough          Triage Vitals: BP 107/67  Pulse 88  Temp(Src) 97.4 F (36.3 C) (Oral)  Resp 16  SpO2 99%  Filed Vitals:   03/11/13 1729 03/11/13 2041  BP: 107/67 116/84  Pulse: 88 66  Temp: 97.4 F (36.3 C) 98.5 F (36.9 C)  TempSrc: Oral Oral  Resp: 16 20  SpO2: 99% 99%     Physical Exam  Nursing note and vitals reviewed. Constitutional: She is oriented to person, place, and time. She appears well-developed and well-nourished. No distress.  HENT:  Head: Normocephalic and atraumatic.  Right Ear: External ear normal.  Left Ear: External ear normal.  Mouth/Throat: Oropharynx is clear and moist.  Eyes: Conjunctivae are normal. Right eye exhibits no discharge. Left eye exhibits no discharge.  Neck: Normal range of motion. Neck supple.  Cardiovascular: Normal rate, regular rhythm, normal heart sounds and intact distal pulses.  Exam reveals no gallop and  no friction rub.   No murmur heard. Pulmonary/Chest: Effort normal and breath sounds normal. No respiratory distress. She has no wheezes. She has no rales. She exhibits no tenderness.  Abdominal: Soft. She exhibits no distension and no mass. There is tenderness. There is no rebound and no guarding.  Tenderness to palpation to the suprapubic/lower middle abdomen  Musculoskeletal: Normal range of motion. She exhibits no edema and no tenderness.  No tenderness to palpation to the back.   Neurological: She is alert and oriented to person, place, and time.  Skin: Skin is warm and dry. No rash noted. No erythema.  No visible rash, lesions, erythema, edema, or ecchymosis to the LE throughout.    Psychiatric: She has a normal mood and affect. Her behavior is normal.    ED Course  Procedures (including critical care time)  DIAGNOSTIC STUDIES: Oxygen Saturation is 99% on room air, normal by my interpretation.    COORDINATION OF CARE: 6:02 PM- Will order UA and blood labs. Will order Benadryl to manage symptoms. Discussed treatment plan with patient at bedside and patient verbalized agreement.    Labs Review Labs Reviewed  CBC WITH DIFFERENTIAL - Abnormal; Notable for the following:    Neutrophils Relative % 41 (*)    Monocytes Relative 13 (*)    All other components within normal limits  URINALYSIS, ROUTINE W REFLEX MICROSCOPIC - Abnormal; Notable for the following:    Ketones, ur 15 (*)    Leukocytes, UA SMALL (*)    All other components within normal limits  PREGNANCY, URINE  COMPREHENSIVE METABOLIC PANEL  URINE MICROSCOPIC-ADD ON   Imaging Review No results found.  EKG Interpretation   None      Results for orders placed during the hospital encounter of 03/11/13  GC/CHLAMYDIA PROBE AMP      Result Value Range   CT Probe RNA NEGATIVE  NEGATIVE   GC Probe RNA NEGATIVE  NEGATIVE  WET PREP, GENITAL      Result Value Range   Yeast Wet Prep HPF POC NONE SEEN  NONE SEEN    Trich, Wet Prep NONE SEEN  NONE SEEN   Clue Cells Wet Prep HPF POC NONE SEEN  NONE SEEN   WBC, Wet Prep HPF POC MANY (*) NONE SEEN  CBC WITH DIFFERENTIAL      Result Value Range   WBC 5.4  4.0 - 10.5 K/uL   RBC 4.43  3.87 - 5.11 MIL/uL   Hemoglobin 14.1  12.0 - 15.0 g/dL   HCT 16.1  09.6 - 04.5 %   MCV 92.1  78.0 - 100.0 fL   MCH 31.8  26.0 - 34.0 pg   MCHC 34.6  30.0 - 36.0 g/dL   RDW 40.9  81.1 - 91.4 %   Platelets  238  150 - 400 K/uL   Neutrophils Relative % 41 (*) 43 - 77 %   Neutro Abs 2.2  1.7 - 7.7 K/uL   Lymphocytes Relative 41  12 - 46 %   Lymphs Abs 2.2  0.7 - 4.0 K/uL   Monocytes Relative 13 (*) 3 - 12 %   Monocytes Absolute 0.7  0.1 - 1.0 K/uL   Eosinophils Relative 5  0 - 5 %   Eosinophils Absolute 0.3  0.0 - 0.7 K/uL   Basophils Relative 0  0 - 1 %   Basophils Absolute 0.0  0.0 - 0.1 K/uL  URINALYSIS, ROUTINE W REFLEX MICROSCOPIC      Result Value Range   Color, Urine YELLOW  YELLOW   APPearance CLEAR  CLEAR   Specific Gravity, Urine 1.027  1.005 - 1.030   pH 6.0  5.0 - 8.0   Glucose, UA NEGATIVE  NEGATIVE mg/dL   Hgb urine dipstick NEGATIVE  NEGATIVE   Bilirubin Urine NEGATIVE  NEGATIVE   Ketones, ur 15 (*) NEGATIVE mg/dL   Protein, ur NEGATIVE  NEGATIVE mg/dL   Urobilinogen, UA 1.0  0.0 - 1.0 mg/dL   Nitrite NEGATIVE  NEGATIVE   Leukocytes, UA SMALL (*) NEGATIVE  PREGNANCY, URINE      Result Value Range   Preg Test, Ur NEGATIVE  NEGATIVE  COMPREHENSIVE METABOLIC PANEL      Result Value Range   Sodium 141  137 - 147 mEq/L   Potassium 4.6  3.7 - 5.3 mEq/L   Chloride 103  96 - 112 mEq/L   CO2 27  19 - 32 mEq/L   Glucose, Bld 78  70 - 99 mg/dL   BUN 9  6 - 23 mg/dL   Creatinine, Ser 1.610.77  0.50 - 1.10 mg/dL   Calcium 9.5  8.4 - 09.610.5 mg/dL   Total Protein 7.6  6.0 - 8.3 g/dL   Albumin 4.2  3.5 - 5.2 g/dL   AST 17  0 - 37 U/L   ALT 12  0 - 35 U/L   Alkaline Phosphatase 87  39 - 117 U/L   Total Bilirubin 0.7  0.3 - 1.2 mg/dL   GFR calc non Af Amer >90   >90 mL/min   GFR calc Af Amer >90  >90 mL/min  URINE MICROSCOPIC-ADD ON      Result Value Range   Squamous Epithelial / LPF RARE  RARE   WBC, UA 0-2  <3 WBC/hpf   RBC / HPF 0-2  <3 RBC/hpf   Bacteria, UA RARE  RARE   Urine-Other MUCOUS PRESENT      MDM   [name] is a 23 y.o. female with a PMH of GERD who presents to the Emergency Department complaining of an itching, burning pain to the legs throughout, which started yesterday.   Rechecks  7:55 PM - Discussed that lab results were normal. Patient reports that the itching has mildly improved after receiving the medications. Will perform a pelvic exam. Discussed treatment plan with patient at bedside and patient verbalized agreement.  8:15 PM = Pelvic exam performed at bedside with ED tech.  Minimal amount of thin white vaginal discharge.  Cervix erythematous.  No friability.  No CMT or adnexal tenderness bilaterally.      Patient evaluated in the ED for abdominal pain and rash.  No visible rash seen on exam. Patient had relief in her itching with Benadryl in the ED.  Etiology of rash possibly due  to a contact vs allergic dermatitis.  Patient also complained of unchanged pelvic pain x 1 year after receiving implanon. Abdominal/pelvic pain likely gynecological in nature.  Pelvic exam unremarkable.  Abdominal exam benign.  Labs unremarkable.  Patient non-toxic and afebrile.  Patient given information for follow-up at [facility].  Return precautions, discharge instructions, and follow-up was discussed with the patient before discharge.     Discharge Medication List as of 03/11/2013  8:46 PM    START taking these medications   Details  hydrocortisone cream 1 % Apply to affected area 2 times daily, Print        Final impressions: 1. Dermatitis   2. Pelvic pain      Thomasenia Sales   I personally performed the services described in this documentation, which was scribed in my  presence. The recorded information has been reviewed and is accurate.          Jillyn Ledger, PA-C 03/12/13 [id number]

## 2013-03-11 NOTE — ED Notes (Signed)
Translator/husband stated, she started itching all over and burning sensation mostly on left leg and bottom of feet

## 2013-03-11 NOTE — Discharge Instructions (Signed)
Follow-up with women's health and a primary doctor  Return to the ED if you have any severe abdominal/back pain, worsening/changing condition, repeated vomiting, fever, weakness, passing out, spreading redness/swelling, or any other concerns (see below)  Apply hydrocortisone cream - stop if this is making your symptoms worse      Abdominal Pain Abdominal pain can be caused by many things. Your caregiver decides the seriousness of your pain by an examination and possibly blood tests and X-rays. Many cases can be observed and treated at home. Most abdominal pain is not caused by a disease and will probably improve without treatment. However, in many cases, more time must pass before a clear cause of the pain can be found. Before that point, it may not be known if you need more testing, or if hospitalization or surgery is needed. HOME CARE INSTRUCTIONS   Do not take laxatives unless directed by your caregiver.  Take pain medicine only as directed by your caregiver.  Only take over-the-counter or prescription medicines for pain, discomfort, or fever as directed by your caregiver.  Try a clear liquid diet (broth, tea, or water) for as long as directed by your caregiver. Slowly move to a bland diet as tolerated. SEEK IMMEDIATE MEDICAL CARE IF:   The pain does not go away.  You have a fever.  You keep throwing up (vomiting).  The pain is felt only in portions of the abdomen. Pain in the right side could possibly be appendicitis. In an adult, pain in the left lower portion of the abdomen could be colitis or diverticulitis.  You pass bloody or black tarry stools. MAKE SURE YOU:   Understand these instructions.  Will watch your condition.  Will get help right away if you are not doing well or get worse. Document Released: 11/29/2004 Document Revised: 05/14/2011 Document Reviewed: 10/08/2007 [facility] Patient Information 2014  [place], [place].   Pelvic Pain, Female Female pelvic pain can  be caused by many different things and start from a variety of places. Pelvic pain refers to pain that is located in the lower half of the abdomen and between your hips. The pain may occur over a short period of time (acute) or may be reoccurring (chronic). The cause of pelvic pain may be related to disorders affecting the female reproductive organs (gynecologic), but it may also be related to the bladder, kidney stones, an intestinal complication, or muscle or skeletal problems. Getting help right away for pelvic pain is important, especially if there has been severe, sharp, or a sudden onset of unusual pain. It is also important to get help right away because some types of pelvic pain can be life threatening.  CAUSES  Below are only some of the causes of pelvic pain. The causes of pelvic pain can be in one of several categories.   Gynecologic.  Pelvic inflammatory disease.  Sexually transmitted infection.  Ovarian cyst or a twisted ovarian ligament (ovarian torsion).  Uterine lining that grows outside the uterus (endometriosis).  Fibroids, cysts, or tumors.  Ovulation.  Pregnancy.  Pregnancy that occurs outside the uterus (ectopic pregnancy).  Miscarriage.  Labor.  Abruption of the placenta or ruptured uterus.  Infection.  Uterine infection (endometritis).  Bladder infection.  Diverticulitis.  Miscarriage related to a uterine infection (septic abortion).  Bladder.  Inflammation of the bladder (cystitis).  Kidney stone(s).  Gastrointenstinal.  Constipation.  Diverticulitis.  Neurologic.  Trauma.  Feeling pelvic pain because of mental or emotional causes (psychosomatic).  Cancers of the bowel or  pelvis. EVALUATION  Your caregiver will want to take a careful history of your concerns. This includes recent changes in your health, a careful gynecologic history of your periods (menses), and a sexual history. Obtaining your family history and medical history is  also important. Your caregiver may suggest a pelvic exam. A pelvic exam will help identify the location and severity of the pain. It also helps in the evaluation of which organ system may be involved. In order to identify the cause of the pelvic pain and be properly treated, your caregiver may order tests. These tests may include:   A pregnancy test.  Pelvic ultrasonography.  An X-ray exam of the abdomen.  A urinalysis or evaluation of vaginal discharge.  Blood tests. HOME CARE INSTRUCTIONS   Only take over-the-counter or prescription medicines for pain, discomfort, or fever as directed by your caregiver.   Rest as directed by your caregiver.   Eat a balanced diet.   Drink enough fluids to make your urine clear or pale yellow, or as directed.   Avoid sexual intercourse if it causes pain.   Apply warm or cold compresses to the lower abdomen depending on which one helps the pain.   Avoid stressful situations.   Keep a journal of your pelvic pain. Write down when it started, where the pain is located, and if there are things that seem to be associated with the pain, such as food or your menstrual cycle.  Follow up with your caregiver as directed.  SEEK MEDICAL CARE IF:  Your medicine does not help your pain.  You have abnormal vaginal discharge. SEEK IMMEDIATE MEDICAL CARE IF:   You have heavy bleeding from the vagina.   Your pelvic pain increases.   You feel lightheaded or faint.   You have chills.   You have pain with urination or blood in your urine.   You have uncontrolled diarrhea or vomiting.   You have a fever or persistent symptoms for more than 3 days.  You have a fever and your symptoms suddenly get worse.   You are being physically or sexually abused.  MAKE SURE YOU:  Understand these instructions.  Will watch your condition.  Will get help if you are not doing well or get worse. Document Released: 01/17/2004 Document Revised:  08/21/2011 Document Reviewed: 06/11/2011 [facility] Patient Information 2014 [place], [place].  Rash A rash is a change in the color or texture of your skin. There are many different types of rashes. You may have other problems that accompany your rash. CAUSES   Infections.  Allergic reactions. This can include allergies to pets or foods.  Certain medicines.  Exposure to certain chemicals, soaps, or cosmetics.  Heat.  Exposure to poisonous plants.  Tumors, both cancerous and noncancerous. SYMPTOMS   Redness.  Scaly skin.  Itchy skin.  Dry or cracked skin.  Bumps.  Blisters.  Pain. DIAGNOSIS  Your caregiver may do a physical exam to determine what type of rash you have. A skin sample (biopsy) may be taken and examined under a microscope. TREATMENT  Treatment depends on the type of rash you have. Your caregiver may prescribe certain medicines. For serious conditions, you may need to see a skin doctor (dermatologist). HOME CARE INSTRUCTIONS   Avoid the substance that caused your rash.  Do not scratch your rash. This can cause infection.  You may take cool baths to help stop itching.  Only take over-the-counter or prescription medicines as directed by your caregiver.  Keep all  follow-up appointments as directed by your caregiver. SEEK IMMEDIATE MEDICAL CARE IF:  You have increasing pain, swelling, or redness.  You have a fever.  You have new or severe symptoms.  You have body aches, diarrhea, or vomiting.  Your rash is not better after 3 days. MAKE SURE YOU:  Understand these instructions.  Will watch your condition.  Will get help right away if you are not doing well or get worse. Document Released: 02/09/2002 Document Revised: 05/14/2011 Document Reviewed: 12/04/2010 [facility] Patient Information 2014 [place], [place].   Emergency Department Resource Guide 1) Find a Doctor and Pay Out of Pocket Although you won't have to find out who is covered by  your insurance plan, it is a good idea to ask around and get recommendations. You will then need to call the office and see if the doctor you have chosen will accept you as a new patient and what types of options they offer for patients who are self-pay. Some doctors offer discounts or will set up payment plans for their patients who do not have insurance, but you will need to ask so you aren't surprised when you get to your appointment.  2) Contact Your Local Health Department Not all health departments have doctors that can see patients for sick visits, but many do, so it is worth a call to see if yours does. If you don't know where your local health department is, you can check in your phone book. The CDC also has a tool to help you locate your state's health department, and many state websites also have listings of all of their local health departments.  3) Find a Walk-in Clinic If your illness is not likely to be very severe or complicated, you may want to try a walk in clinic. These are popping up all over the country in pharmacies, drugstores, and shopping centers. They're usually staffed by nurse practitioners or physician assistants that have been trained to treat common illnesses and complaints. They're usually fairly quick and inexpensive. However, if you have serious medical issues or chronic medical problems, these are probably not your best option.  No Primary Care Doctor: - Call Health Connect at  [phone] - they can help you locate a primary care doctor that  accepts your insurance, provides certain services, etc. - Physician Referral Service- [phone]  Chronic Pain Problems: Organization         Address  Phone   Notes  [facility]  [phone] Patients need to be referred by their primary care doctor.   Medication Assistance: Organization         Address  Phone   Notes  [facility] Medication [facility] [address] [place], [place] [address] [phone] --Must be a resident of [facility] -- Must have NO insurance coverage whatsoever (no Medicaid/ Medicare, etc.) -- The pt. MUST have a primary care doctor that directs their care regularly and follows them in the community   MedAssist  [phone]   [employer]  [phone]    Agencies that provide inexpensive medical care: Organization         Address  Phone   Notes  [facility]  [phone]   [facility]    [phone]   [facility] [address] [place], [place] [address] [phone]   [facility] [address] [place] [phone]   Planned  Parenthood    [phone]   [facility]    [phone]   [facility]  [address], [place] Phone:  [phone], Fax:  [phone] Hours of Operation:  9 am - 6 pm, M-F.  Also accepts Medicaid/Medicare and self-pay.  [facility]  [address], [place] Phone: [phone], Fax: [phone]. Hours of Operation:  8:30 am - 5:30 pm, M-F.  Also accepts Medicaid and self-pay.  [facility] [place] [address], [place] Phone: [phone]   [facility] [address] Natasha Bence [place], [place] [phone], Ext. [phone] Mondays & Thursdays: 7-9 AM.  First 15 patients are seen on a first come, first serve basis.    Medicaid-accepting [facility] Providers:  Organization         Address  Phone   Notes  [facility] [address],  [phone] Also accepts self-pay patients.  [facility] [address] Laurell Josephs [place], [place]  [phone]   [facility] [address], [place] [phone]   [facility] [address] [place] [phone]   Renaye Rakers [address], [place]   [phone] Only accepts [place] patients after they have their name applied to their card.   Self-Pay (no insurance) in [facility]:  Organization         Address  Phone   Notes  Sickle Cell Patients, [facility] [address] [place], [place] [phone]   [facility] [address] [place], [place] [phone]   [facility] [place]  [address], [place] [phone]   [facility]/Dr. Osei-Bonsu  [address] [place] or [address], [place] [phone] Phone number for both [place] and [place] locations is the same.  Urgent Medical and [facility] [address] [place] [phone]   [facility] [address] [place] or [address] [phone]   [facility] [address], [place] [phone], phone; [phone], fax Sees patients 1st and 3rd Saturday of every month.  Must not qualify for public or private insurance (i.e. Medicaid, Medicare, [place] Health Choice, Veterans' Benefits)  Household income should be no more than 200% of the poverty level The clinic cannot treat you if you are pregnant or think you are pregnant  Sexually transmitted diseases are not treated at the clinic.    Dental Care: Organization         Address  Phone  Notes  [facility] Department of [facility] [address] [place], [place] [phone] Accepts children up to age 37 who are enrolled in [place] or [place] Health Choice; pregnant women with a Medicaid card; and children who have applied for Medicaid or [place] Health Choice, but were declined, whose parents can pay a reduced fee at time of service.  [facility]  [address], [place] [phone] Accepts children up to age 52 who are enrolled  in [place] or [place] Health Choice; pregnant women with a Medicaid card; and children who have applied for Medicaid or [place] Health Choice, but were declined, whose  parents can pay a reduced fee at time of service.  [facility]  [address] [place], [place] [phone] Patients are seen by appointment only. Walk-ins are not accepted. [facility] will see patients 41 years of age and older. Monday - Tuesday (8am-5pm) Most Wednesdays (8:30-5pm) $30 per visit, cash only  [facility] Access [facility]  [address], [facility] [phone] Patients are seen by appointment only. Walk-ins are not accepted. [facility] will see patients 45 years of age and older. One Wednesday Evening (Monthly: Volunteer Based).  $30 per visit, cash only  [employer]  [phone] for adults; Children under age 66, call Graduate Pediatric Dentistry at [phone]. Children aged 74-14, please call [phone] to request a pediatric application.  Dental services are provided in all areas of dental care including fillings, crowns and bridges, complete and partial dentures, implants, gum treatment, root canals, and extractions. Preventive care is also provided. Treatment is provided to both adults and children. Patients are selected via a lottery and there is often a waiting list.   [facility] [address] [place]  [phone] [email or web address]   [facility] [address] [place], [place] [phone], Ext. [phone] Second and Fourth Thursday of each month, opens at 6:30 AM; Clinic ends at 9 AM.  Patients are seen on a first-come first-served basis, and a limited number are seen during each clinic.   [facility]  [address] Ether Griffins [place], [place] [phone]   Eligibility Requirements You must have lived in [place], [place], or [place] counties for at least the last three months.   You cannot be  eligible for state or federal sponsored [place], including [employer], [place], or [employer].   You generally cannot be eligible for healthcare insurance through your employer.    How to apply: Eligibility screenings are held every Tuesday and Wednesday afternoon from 1:00 pm until 4:00 pm. You do not need an appointment for the interview!  [facility] [address], [place], [place] [phone]   [facility] Health Department  [phone]   [facility] Health Department  [phone]   [facility]  [phone]    Behavioral Health Resources in the Community: Intensive Outpatient Programs Organization         Address  Phone  Notes  [facility] [address], [place], [place] [phone]   [facility] [address] [place], [place] [phone]   [facility] [address], [place], [place]  [phone]   [facility] Mental Health [address],  [place], [place] [phone] or [phone]   Substance Abuse Resources Organization         Address  Phone  Notes  [facility] Drug Services  [phone]   [facility]  [phone]   [facility]  [phone]   Floydene Flock  [phone]   Residential & Outpatient Substance Abuse Program  [phone]   Psychological Services Organization         Address  Phone  Notes  [facility] Behavioral Health  [phone]   [facility] Services  [phone]   [facility] Mental Health [address], [place] [phone] or [phone]    Mobile Crisis Teams Organization         Address  Phone  Notes  [facility], Mobile Crisis Care  Unit  [phone]   [facility]  [address] [place], [place] [phone]   [facility]sch [address] [place] [phone]    Self-Help/Support Groups Organization          Address  Phone             Notes  [facility] [place] - variety of support groups  [phone] Call for more information  Narcotics Anonymous (NA), [facility] [address], [place]  2 meetings at this location   Statisticianesidential Treatment Programs Organization         Address  Phone  Notes  [facility] [address],    [place]  [phone]   [facility]  [address], [place] [phone], [place], [place] [phone]   [facility] [address], [place] [phone] Admissions: 8am-3pm M-F  [facility] [address]    [place], [place] [phone]   [facility] [address], [place], [place] [phone]   [facility] [address]  [place], [place] [phone]   [facility] - Intensive Outpatient [address], [place], [place] [phone]   [facility] ([facility] [address]  [place], [place] [phone] or [phone]   [facility] ([facility]) [address] [place], [place] [phone] Accepts Medicaid  [facility] [address]  [place] [phone] Substance Abuse/Addiction Treatment   [facility] Behavioral Health Resources Organization         Address  Phone  Notes  [facility]  [phone]) [phone]   Angie FavaJulie Brannon, PhD [address] [place], [place]   [phone]) [phone] or [phone](336) [phone]   [facility]   [address] [place], [place] [phone]) [phone]   [facility] [address], [place], [place] [phone]) [phone] Insurance/Medicaid/sponsorship through [facility]  [facility] [address]                                    [place], [place] [phone] Therapy/tele-psych/case  [facility] [address]   [place], [place] [phone]) [phone]    Dr. Lolly MustacheArfeen  [phone](336) [phone]   [facility]  [employer]  [facility] 1) [address], [place] 2) [address], [place] 3)  [address], [place] [phone](336) [phone](336) [phone]  [phone]   [facility] Child Abuse Hotline [phone](336) [phone] or [phone](336) [phone] (After Hours)

## 2013-03-12 LAB — GC/CHLAMYDIA PROBE AMP
CT PROBE, AMP APTIMA: NEGATIVE
GC Probe RNA: NEGATIVE

## 2013-03-13 ENCOUNTER — Emergency Department (HOSPITAL_COMMUNITY): Payer: BC Managed Care – PPO

## 2013-03-13 ENCOUNTER — Emergency Department (HOSPITAL_COMMUNITY)
Admission: EM | Admit: 2013-03-13 | Discharge: 2013-03-13 | Disposition: A | Discharge status: Home / Self Care | Payer: BC Managed Care – PPO | Attending: Emergency Medicine | Admitting: Emergency Medicine

## 2013-03-13 ENCOUNTER — Encounter (HOSPITAL_COMMUNITY): Payer: Self-pay | Admitting: Emergency Medicine

## 2013-03-13 DIAGNOSIS — M5126 Other intervertebral disc displacement, lumbar region: Secondary | ICD-10-CM | POA: Insufficient documentation

## 2013-03-13 DIAGNOSIS — M51369 Other intervertebral disc degeneration, lumbar region without mention of lumbar back pain or lower extremity pain: Secondary | ICD-10-CM

## 2013-03-13 DIAGNOSIS — Z3202 Encounter for pregnancy test, result negative: Secondary | ICD-10-CM | POA: Insufficient documentation

## 2013-03-13 DIAGNOSIS — M79609 Pain in unspecified limb: Secondary | ICD-10-CM | POA: Insufficient documentation

## 2013-03-13 DIAGNOSIS — R110 Nausea: Secondary | ICD-10-CM | POA: Insufficient documentation

## 2013-03-13 DIAGNOSIS — R5383 Other fatigue: Secondary | ICD-10-CM

## 2013-03-13 DIAGNOSIS — IMO0002 Reserved for concepts with insufficient information to code with codable children: Secondary | ICD-10-CM | POA: Insufficient documentation

## 2013-03-13 DIAGNOSIS — R5381 Other malaise: Secondary | ICD-10-CM | POA: Insufficient documentation

## 2013-03-13 DIAGNOSIS — M5136 Other intervertebral disc degeneration, lumbar region: Secondary | ICD-10-CM

## 2013-03-13 DIAGNOSIS — Z8719 Personal history of other diseases of the digestive system: Secondary | ICD-10-CM | POA: Insufficient documentation

## 2013-03-13 LAB — URINE MICROSCOPIC-ADD ON

## 2013-03-13 LAB — URINALYSIS, ROUTINE W REFLEX MICROSCOPIC
Bilirubin Urine: NEGATIVE
GLUCOSE, UA: NEGATIVE mg/dL
HGB URINE DIPSTICK: NEGATIVE
Ketones, ur: NEGATIVE mg/dL
Nitrite: NEGATIVE
PH: 5 (ref 5.0–8.0)
PROTEIN: NEGATIVE mg/dL
SPECIFIC GRAVITY, URINE: 1.006 (ref 1.005–1.030)
Urobilinogen, UA: 0.2 mg/dL (ref 0.0–1.0)

## 2013-03-13 LAB — POCT PREGNANCY, URINE: PREG TEST UR: NEGATIVE

## 2013-03-13 MED ORDER — TRAMADOL HCL 50 MG PO TABS: 50.0000 mg | ORAL_TABLET | Freq: Four times a day (QID) | ORAL | Status: DC | PRN

## 2013-03-13 MED ORDER — TRAMADOL HCL 50 MG PO TABS
50.0000 mg | ORAL_TABLET | Freq: Once | ORAL | Status: AC
  Administered 2013-03-13: 50 mg via ORAL
  Filled 2013-03-13: qty 1

## 2013-03-13 NOTE — Discharge Instructions (Signed)
Lumbosacral Radiculopathy Lumbosacral radiculopathy is a pinched nerve or nerves in the low back (lumbosacral area). When this happens you may have weakness in your legs and may not be able to stand on your toes. You may have pain going down into your legs. There may be difficulties with walking normally. There are many causes of this problem. Sometimes this may happen from an injury, or simply from arthritis or boney problems. It may also be caused by other illnesses such as diabetes. If there is no improvement after treatment, further studies may be done to find the exact cause. DIAGNOSIS  X-rays may be needed if the problems become long standing. Electromyograms may be done. This study is one in which the working of nerves and muscles is studied. HOME CARE INSTRUCTIONS   Applications of ice packs may be helpful. Ice can be used in a plastic bag with a towel around it to prevent frostbite to skin. This may be used every 2 hours for 20 to 30 minutes, or as needed, while awake, or as directed by your caregiver.  Only take over-the-counter or prescription medicines for pain, discomfort, or fever as directed by your caregiver.  If physical therapy was prescribed, follow your caregiver's directions. SEEK IMMEDIATE MEDICAL CARE IF:   You have pain not controlled with medications.  You seem to be getting worse rather than better.  You develop increasing weakness in your legs.  You develop loss of bowel or bladder control.  You have difficulty with walking or balance, or develop clumsiness in the use of your legs.  You have a fever. MAKE SURE YOU:   Understand these instructions.  Will watch your condition.  Will get help right away if you are not doing well or get worse. Document Released: 02/19/2005 Document Revised: 05/14/2011 Document Reviewed: 10/10/2007 [facility] Patient Information 2014 [place], [place]. Neuropathic Pain We often think that pain has a physical cause. If we get rid of  the cause, the pain should go away. Nerves themselves can also cause pain. It is called neuropathic pain, which means nerve abnormality. It may be difficult for the patients who have it and for the treating caregivers. Pain is usually described as acute (short-lived) or chronic (long-lasting). Acute pain is related to the physical sensations caused by an injury. It can last from a few seconds to many weeks, but it usually goes away when normal healing occurs. Chronic pain lasts beyond the typical healing time. With neuropathic pain, the nerve fibers themselves may be damaged or injured. They then send incorrect signals to other pain centers. The pain you feel is real, but the cause is not easy to find.  CAUSES  Chronic pain can result from diseases, such as diabetes and shingles (an infection related to chickenpox), or from trauma, surgery, or amputation. It can also happen without any known injury or disease. The nerves are sending pain messages, even though there is no identifiable cause for such messages.   Other common causes of neuropathy include diabetes, phantom limb pain, or Regional Pain Syndrome (RPS).  As with all forms of chronic back pain, if neuropathy is not correctly treated, there can be a number of associated problems that lead to a downward cycle for the patient. These include depression, sleeplessness, feelings of fear and anxiety, limited social interaction and inability to do normal daily activities or work.  The most dramatic and mysterious example of neuropathic pain is called "phantom limb syndrome." This occurs when an arm or a leg has  been removed because of illness or injury. The brain still gets pain messages from the nerves that originally carried impulses from the missing limb. These nerves now seem to misfire and cause troubling pain.  Neuropathic pain often seems to have no cause. It responds poorly to standard pain treatment. Neuropathic pain can occur after:  Shingles  (herpes zoster virus infection).  A lasting burning sensation of the skin, caused usually by injury to a peripheral nerve.  Peripheral neuropathy which is widespread nerve damage, often caused by diabetes or alcoholism.  Phantom limb pain following an amputation.  Facial nerve problems (trigeminal neuralgia).  Multiple sclerosis.  Reflex sympathetic dystrophy.  Pain which comes with cancer and cancer chemotherapy.  Entrapment neuropathy such as when pressure is put on a nerve such as in carpal tunnel syndrome.  Back, leg, and hip problems (sciatica).  Spine or back surgery.  HIV Infection or AIDS where nerves are infected by viruses. Your caregiver can explain items in the above list which may apply to you. SYMPTOMS  Characteristics of neuropathic pain are:  Severe, sharp, electric shock-like, shooting, lightening-like, knife-like.  Pins and needles sensation.  Deep burning, deep cold, or deep ache.  Persistent numbness, tingling, or weakness.  Pain resulting from light touch or other stimulus that would not usually cause pain.  Increased sensitivity to something that would normally cause pain, such as a pinprick. Pain may persist for months or years following the healing of damaged tissues. When this happens, pain signals no longer sound an alarm about current injuries or injuries about to happen. Instead, the alarm system itself is not working correctly.  Neuropathic pain may get worse instead of better over time. For some people, it can lead to serious disability. It is important to be aware that severe injury in a limb can occur without a proper, protective pain response.Burns, cuts, and other injuries may go unnoticed. Without proper treatment, these injuries can become infected or lead to further disability. Take any injury seriously, and consult your caregiver for treatment. DIAGNOSIS  When you have a pain with no known cause, your caregiver will probably ask some  specific questions:   Do you have any other conditions, such as diabetes, shingles, multiple sclerosis, or HIV infection?  How would you describe your pain? (Neuropathic pain is often described as shooting, stabbing, burning, or searing.)  Is your pain worse at any time of the day? (Neuropathic pain is usually worse at night.)  Does the pain seem to follow a certain physical pathway?  Does the pain come from an area that has missing or injured nerves? (An example would be phantom limb pain.)  Is the pain triggered by minor things such as rubbing against the sheets at night? These questions often help define the type of pain involved. Once your caregiver knows what is happening, treatment can begin. Anticonvulsant, antidepressant drugs, and various pain relievers seem to work in some cases. If another condition, such as diabetes is involved, better management of that disorder may relieve the neuropathic pain.  TREATMENT  Neuropathic pain is frequently long-lasting and tends not to respond to treatment with narcotic type pain medication. It may respond well to other drugs such as antiseizure and antidepressant medications. Usually, neuropathic problems do not completely go away, but partial improvement is often possible with proper treatment. Your caregivers have large numbers of medications available to treat you. Do not be discouraged if you do not get immediate relief. Sometimes different medications or a combination of  medications will be tried before you receive the results you are hoping for. See your caregiver if you have pain that seems to be coming from nowhere and does not go away. Help is available.  SEEK IMMEDIATE MEDICAL CARE IF:   There is a sudden change in the quality of your pain, especially if the change is on only one side of the body.  You notice changes of the skin, such as redness, black or purple discoloration, swelling, or an ulcer.  You cannot move the affected  limbs. Document Released: 11/17/2003 Document Revised: 05/14/2011 Document Reviewed: 11/17/2003 [facility] Patient Information 2014 [place], [place].

## 2013-03-13 NOTE — ED Notes (Signed)
Pt was seen here 2 days ago for same. Presents with skin itching and burning sensations from abdomen down to toes. Pt is unable to sit still do to scratching. Itching is worse on bottom of feet and burning begins after itching stops. Benadryl is not helping.

## 2013-03-13 NOTE — ED Provider Notes (Signed)
CSN: [id number]     Arrival date & time 03/13/13  1643 History  This chart was scribed for non-physician practitioner, Roxy Horseman, PA-C working with Candyce Churn, MD by Greggory Stallion, ED scribe. This patient was seen in room TR08C/TR08C and the patient's care was started at 5:22 PM.   Chief Complaint  Patient presents with  . Pain   The history is provided by the patient. A language interpreter was used.   HPI Comments: [name] is a 23 y.o. female who presents to the Emergency Department complaining of itching, burning pain from her abdomen to her feet that started 5 days ago. She states it is worse on the soles of her feet. Pt has also had some nausea. She was seen here 2 days ago for the same and given hydrocortisone cream and Benadryl with no relief. Pt states she has lower back pain and mild weakness in her legs and right arm that started at the same time. She states she has never experienced these symptoms before. Denies new lotions, soaps or detergents. Denies fever, chills, visual disturbance, emesis, diarrhea, constipation, dysuria, vaginal discharge, bowel or baldder incontinence. Denies allergies to medications.   Past Medical History  Diagnosis Date  . GERD (gastroesophageal reflux disease)    Past Surgical History  Procedure Laterality Date  . No past surgeries     Family History  Problem Relation Age of Onset  . Anesthesia problems Neg Hx   . Hypotension Neg Hx   . Malignant hyperthermia Neg Hx   . Pseudochol deficiency Neg Hx   . Other Neg Hx    History  Substance Use Topics  . Smoking status: Never Smoker   . Smokeless tobacco: Never Used  . Alcohol Use: No   OB History   Grav Para Term Preterm Abortions TAB SAB Ect Mult Living   1 1 1  0 0 0 0 0 0 1     Review of Systems A complete 10 system review of systems was obtained and all systems are negative except as noted in the HPI and PMH.   Allergies  Review of patient's allergies indicates no known  allergies.  Home Medications   Current Outpatient Rx  Name  Route  Sig  Dispense  Refill  . hydrocortisone cream 1 %      Apply to affected area 2 times daily   15 g   0    BP 126/77  Pulse 74  Temp(Src) 97.8 F (36.6 C) (Oral)  Resp 18  Wt 135 lb 4 oz (61.349 kg)  SpO2 100%  Physical Exam  Nursing note and vitals reviewed. Constitutional: She is oriented to person, place, and time. She appears well-developed and well-nourished. No distress.  HENT:  Head: Normocephalic and atraumatic.  Eyes: EOM are normal.  Neck: Neck supple. No tracheal deviation present.  Cardiovascular: Normal rate.   Pulmonary/Chest: Effort normal. No respiratory distress.  Musculoskeletal: Normal range of motion.  Lumbar spinal tenderness.   Neurological: She is alert and oriented to person, place, and time.  Reflex Scores:      Patellar reflexes are 3+ on the right side and 3+ on the left side. Sensation and strength intact to bilateral upper and lower extremities.   Skin: Skin is warm and dry.  Psychiatric: She has a normal mood and affect. Her behavior is normal.   ED Course  Procedures (including critical care time)  DIAGNOSTIC STUDIES: Oxygen Saturation is 100% on RA, normal by my interpretation.  COORDINATION OF CARE: 5:32 PM-Discussed treatment plan which includes MRI with pt at bedside and pt agreed to plan.   Labs Review Labs Reviewed  URINALYSIS, ROUTINE W REFLEX MICROSCOPIC - Abnormal; Notable for the following:    Leukocytes, UA SMALL (*)    All other components within normal limits  URINE MICROSCOPIC-ADD ON  POCT PREGNANCY, URINE   Imaging Review Mr Lumbar Spine Wo Contrast  03/13/2013   CLINICAL DATA:  Itching and burning sensation of the lower body.  EXAM: MRI LUMBAR SPINE WITHOUT CONTRAST  TECHNIQUE: Multiplanar, multisequence MR imaging was performed. No intravenous contrast was administered.  COMPARISON:  None.  FINDINGS: Alignment is normal. The discs are normal  except for a minor disc bulge at L4-5. There is no herniation or stenosis. No osseous or articular pathology. The distal cord and conus are normal with conus tip at L2. Paravertebral soft tissues appear normal.  IMPRESSION: Negative study. Minimal disc bulge L4-5. No cause of the described symptoms identified.   Electronically Signed   By: Paulina FusiMark  Shogry M.D.   On: 03/13/2013 19:36    EKG Interpretation   None       MDM   1. L4-L5 disc bulge     Patient with lumbar spine tenderness, and bilateral anterior lower extremity pain and burning sensation. Symptoms have been progressively worsening for the past 5 days. Nothing improves her symptoms.  She also states that she has an Water quality scientist"electric shock" sensation when she urinated today.  UA and GC test are negative from 5 days ago.  Prior workup reviewed.  The bilateral nature of the symptoms along with the urinary "shock" with a negative UA are concerning.  Will order MRI of lumbar spine.  I discussed this with Dr. Loretha StaplerWofford.  MRI is negative. Mild disc bulge at L4-L5. Recommend anti-inflammatories, and will give Ultram. Discharge with orthopedic/neurosurgery followup. Patient is stable and ready for discharge.  I personally performed the services described in this documentation, which was scribed in my presence. The recorded information has been reviewed and is accurate.    Roxy Horsemanobert Caley Volkert, PA-C 03/13/13 1950

## 2013-03-14 ENCOUNTER — Emergency Department (HOSPITAL_COMMUNITY)
Admission: EM | Admit: 2013-03-14 | Discharge: 2013-03-14 | Disposition: A | Discharge status: Home / Self Care | Payer: BC Managed Care – PPO | Attending: Emergency Medicine | Admitting: Emergency Medicine

## 2013-03-14 ENCOUNTER — Encounter (HOSPITAL_COMMUNITY): Payer: Self-pay | Admitting: Emergency Medicine

## 2013-03-14 DIAGNOSIS — Z3202 Encounter for pregnancy test, result negative: Secondary | ICD-10-CM | POA: Insufficient documentation

## 2013-03-14 DIAGNOSIS — R109 Unspecified abdominal pain: Secondary | ICD-10-CM

## 2013-03-14 DIAGNOSIS — R1110 Vomiting, unspecified: Secondary | ICD-10-CM

## 2013-03-14 DIAGNOSIS — F411 Generalized anxiety disorder: Secondary | ICD-10-CM | POA: Insufficient documentation

## 2013-03-14 DIAGNOSIS — N390 Urinary tract infection, site not specified: Secondary | ICD-10-CM

## 2013-03-14 DIAGNOSIS — Z8719 Personal history of other diseases of the digestive system: Secondary | ICD-10-CM | POA: Insufficient documentation

## 2013-03-14 DIAGNOSIS — R1084 Generalized abdominal pain: Secondary | ICD-10-CM | POA: Insufficient documentation

## 2013-03-14 DIAGNOSIS — R112 Nausea with vomiting, unspecified: Secondary | ICD-10-CM | POA: Insufficient documentation

## 2013-03-14 LAB — URINALYSIS, ROUTINE W REFLEX MICROSCOPIC
BILIRUBIN URINE: NEGATIVE
GLUCOSE, UA: NEGATIVE mg/dL
HGB URINE DIPSTICK: NEGATIVE
Ketones, ur: >80 mg/dL — AB
Nitrite: NEGATIVE
Protein, ur: 30 mg/dL — AB
SPECIFIC GRAVITY, URINE: 1.017 (ref 1.005–1.030)
Urobilinogen, UA: 1 mg/dL (ref 0.0–1.0)
pH: 8.5 — ABNORMAL HIGH (ref 5.0–8.0)

## 2013-03-14 LAB — CBC WITH DIFFERENTIAL/PLATELET
BASOS ABS: 0 10*3/uL (ref 0.0–0.1)
BASOS PCT: 0 % (ref 0–1)
Eosinophils Absolute: 0 10*3/uL (ref 0.0–0.7)
Eosinophils Relative: 0 % (ref 0–5)
HEMATOCRIT: 39.7 % (ref 36.0–46.0)
Hemoglobin: 14.3 g/dL (ref 12.0–15.0)
Lymphocytes Relative: 15 % (ref 12–46)
Lymphs Abs: 1.1 10*3/uL (ref 0.7–4.0)
MCH: 31.6 pg (ref 26.0–34.0)
MCHC: 36 g/dL (ref 30.0–36.0)
MCV: 87.8 fL (ref 78.0–100.0)
MONO ABS: 0.4 10*3/uL (ref 0.1–1.0)
Monocytes Relative: 6 % (ref 3–12)
NEUTROS ABS: 6.1 10*3/uL (ref 1.7–7.7)
Neutrophils Relative %: 80 % — ABNORMAL HIGH (ref 43–77)
PLATELETS: 239 10*3/uL (ref 150–400)
RBC: 4.52 MIL/uL (ref 3.87–5.11)
RDW: 12.1 % (ref 11.5–15.5)
WBC: 7.7 10*3/uL (ref 4.0–10.5)

## 2013-03-14 LAB — URINE MICROSCOPIC-ADD ON

## 2013-03-14 LAB — COMPREHENSIVE METABOLIC PANEL
ALBUMIN: 4.8 g/dL (ref 3.5–5.2)
ALT: 12 U/L (ref 0–35)
AST: 18 U/L (ref 0–37)
Alkaline Phosphatase: 95 U/L (ref 39–117)
BILIRUBIN TOTAL: 1 mg/dL (ref 0.3–1.2)
BUN: 10 mg/dL (ref 6–23)
CALCIUM: 9.9 mg/dL (ref 8.4–10.5)
CHLORIDE: 98 meq/L (ref 96–112)
CO2: 22 mEq/L (ref 19–32)
CREATININE: 0.53 mg/dL (ref 0.50–1.10)
GFR calc Af Amer: >90 mL/min (ref >90)
GFR calc non Af Amer: >90 mL/min (ref >90)
Glucose, Bld: 99 mg/dL (ref 70–99)
Potassium: 3.6 mEq/L — ABNORMAL LOW (ref 3.7–5.3)
SODIUM: 139 meq/L (ref 137–147)
Total Protein: 8.1 g/dL (ref 6.0–8.3)

## 2013-03-14 LAB — PREGNANCY, URINE: PREG TEST UR: NEGATIVE

## 2013-03-14 MED ORDER — ONDANSETRON HCL 4 MG/2ML IJ SOLN
4.0000 mg | Freq: Once | INTRAMUSCULAR | Status: AC
  Administered 2013-03-14: 4 mg via INTRAVENOUS
  Filled 2013-03-14: qty 2

## 2013-03-14 MED ORDER — SULFAMETHOXAZOLE-TRIMETHOPRIM 800-160 MG PO TABS: 1.0000 | ORAL_TABLET | Freq: Two times a day (BID) | ORAL | Status: DC

## 2013-03-14 MED ORDER — KETOROLAC TROMETHAMINE 15 MG/ML IJ SOLN
15.0000 mg | Freq: Once | INTRAMUSCULAR | Status: AC
  Administered 2013-03-14: 15 mg via INTRAVENOUS
  Filled 2013-03-14: qty 1

## 2013-03-14 MED ORDER — NAPROXEN 375 MG PO TABS: 375.0000 mg | ORAL_TABLET | Freq: Two times a day (BID) | ORAL | Status: DC

## 2013-03-14 MED ORDER — ONDANSETRON HCL 4 MG PO TABS: 4.0000 mg | ORAL_TABLET | Freq: Four times a day (QID) | ORAL | Status: DC

## 2013-03-14 MED ORDER — SODIUM CHLORIDE 0.9 % IV BOLUS (SEPSIS)
1000.0000 mL | Freq: Once | INTRAVENOUS | Status: AC
  Administered 2013-03-14: 1000 mL via INTRAVENOUS

## 2013-03-14 NOTE — ED Notes (Signed)
Pt discharged at 2020 and out of room.

## 2013-03-14 NOTE — ED Notes (Signed)
Pt here due to not being able to take pain medicine due to nausea and vomiting. Pt appears very uncomfortable and anxious. Husband at bedside. Pt reports lower abdominal pain and burning reflux discomfort. Denies GU symptoms.  MD at bedside for assessment, aware of symptoms.

## 2013-03-14 NOTE — ED Provider Notes (Signed)
Medical screening examination/treatment/procedure(s) were performed by non-physician practitioner and as supervising physician I was immediately available for consultation/collaboration.  EKG Interpretation   None         Zaivion Kundrat David Gilmore List, MD 03/14/13 1349 

## 2013-03-14 NOTE — ED Notes (Signed)
The patient is unable to give urine specimen at this time. The patient has been advised to use call light for assistance to restroom. The RN in charge is aware.

## 2013-03-14 NOTE — Discharge Instructions (Signed)
Urinary Tract Infection Urinary tract infections (UTIs) can develop anywhere along your urinary tract. Your urinary tract is your body's drainage system for removing wastes and extra water. Your urinary tract includes two kidneys, two ureters, a bladder, and a urethra. Your kidneys are a pair of bean-shaped organs. Each kidney is about the size of your fist. They are located below your ribs, one on each side of your spine. CAUSES Infections are caused by microbes, which are microscopic organisms, including fungi, viruses, and bacteria. These organisms are so small that they can only be seen through a microscope. Bacteria are the microbes that most commonly cause UTIs. SYMPTOMS  Symptoms of UTIs may vary by age and gender of the patient and by the location of the infection. Symptoms in young women typically include a frequent and intense urge to urinate and a painful, burning feeling in the bladder or urethra during urination. Older women and men are more likely to be tired, shaky, and weak and have muscle aches and abdominal pain. A fever may mean the infection is in your kidneys. Other symptoms of a kidney infection include pain in your back or sides below the ribs, nausea, and vomiting. DIAGNOSIS To diagnose a UTI, your caregiver will ask you about your symptoms. Your caregiver also will ask to provide a urine sample. The urine sample will be tested for bacteria and white blood cells. White blood cells are made by your body to help fight infection. TREATMENT  Typically, UTIs can be treated with medication. Because most UTIs are caused by a bacterial infection, they usually can be treated with the use of antibiotics. The choice of antibiotic and length of treatment depend on your symptoms and the type of bacteria causing your infection. HOME CARE INSTRUCTIONS  If you were prescribed antibiotics, take them exactly as your caregiver instructs you. Finish the medication even if you feel better after you  have only taken some of the medication.  Drink enough water and fluids to keep your urine clear or pale yellow.  Avoid caffeine, tea, and carbonated beverages. They tend to irritate your bladder.  Empty your bladder often. Avoid holding urine for long periods of time.  Empty your bladder before and after sexual intercourse.  After a bowel movement, women should cleanse from front to back. Use each tissue only once. SEEK MEDICAL CARE IF:   You have back pain.  You develop a fever.  Your symptoms do not begin to resolve within 3 days. SEEK IMMEDIATE MEDICAL CARE IF:   You have severe back pain or lower abdominal pain.  You develop chills.  You have nausea or vomiting.  You have continued burning or discomfort with urination. MAKE SURE YOU:   Understand these instructions.  Will watch your condition.  Will get help right away if you are not doing well or get worse. Document Released: 11/29/2004 Document Revised: 08/21/2011 Document Reviewed: 03/30/2011 [facility] Patient Information 2014 [place], [place].  Nausea and Vomiting Nausea means you feel sick to your stomach. Throwing up (vomiting) is a reflex where stomach contents come out of your mouth. HOME CARE   Take medicine as told by your doctor.  Do not force yourself to eat. However, you do need to drink fluids.  If you feel like eating, eat a normal diet as told by your doctor.  Eat rice, wheat, potatoes, bread, lean meats, yogurt, fruits, and vegetables.  Avoid high-fat foods.  Drink enough fluids to keep your pee (urine) clear or pale yellow.  Ask your doctor how to replace body fluid losses (rehydrate). Signs of body fluid loss (dehydration) include:  Feeling very thirsty.  Dry lips and mouth.  Feeling dizzy.  Dark pee.  Peeing less than normal.  Feeling confused.  Fast breathing or heart rate. GET HELP RIGHT AWAY IF:   You have blood in your throw up.  You have black or bloody poop  (stool).  You have a bad headache or stiff neck.  You feel confused.  You have bad belly (abdominal) pain.  You have chest pain or trouble breathing.  You do not pee at least once every 8 hours.  You have cold, clammy skin.  You keep throwing up after 24 to 48 hours.  You have a fever. MAKE SURE YOU:   Understand these instructions.  Will watch your condition.  Will get help right away if you are not doing well or get worse. Document Released: 08/08/2007 Document Revised: 05/14/2011 Document Reviewed: 07/21/2010 [facility] Patient Information 2014 [place], [place].  Abdominal Pain, Adult Many things can cause belly (abdominal) pain. Most times, the belly pain is not dangerous. Many cases of belly pain can be watched and treated at home. HOME CARE   Do not take medicines that help you go poop (laxatives) unless told to by your doctor.  Only take medicine as told by your doctor.  Eat or drink as told by your doctor. Your doctor will tell you if you should be on a special diet. GET HELP IF:  You do not know what is causing your belly pain.  You have belly pain while you are sick to your stomach (nauseous) or have runny poop (diarrhea).  You have pain while you pee or poop.  Your belly pain wakes you up at night.  You have belly pain that gets worse or better when you eat.  You have belly pain that gets worse when you eat fatty foods. GET HELP RIGHT AWAY IF:   The pain does not go away within 2 hours.  You have a fever.  You keep throwing up (vomiting).  The pain changes and is only in the right or left part of the belly.  You have bloody or tarry looking poop. MAKE SURE YOU:   Understand these instructions.  Will watch your condition.  Will get help right away if you are not doing well or get worse. Document Released: 08/08/2007 Document Revised: 12/10/2012 Document Reviewed: 10/29/2012 [facility] Patient Information 2014 [place], [place].

## 2013-03-14 NOTE — ED Notes (Signed)
Husband stated, she was in here on Wed. For itching and given medicine and back yesterday sick with N/V.  She can't take the medication cause she vomits.

## 2013-03-14 NOTE — ED Provider Notes (Addendum)
CSN: [id number]     Arrival date & time 03/14/13  1353 History   First MD Initiated Contact with Patient 03/14/13 1423     Chief Complaint  Patient presents with  . Nausea  . Emesis   (Consider location/radiation/quality/duration/timing/severity/associated sxs/prior Treatment) HPI Comments: 23 yo female presents with cc of n/v which started at 0600 this AM.  Pt was seen in our ER twice last week on 1/7 and 1/9.  She was diagnosed with L4-L5 disc bulge.  Her symptoms were bilat leg burning and itching.    Pt denies recent trauma, infection, vaginal symptoms,or GU symptoms.   Pt is not taking ultram or benadryl b/c she can't keep it down.  No tobacco, EtOH, or drugs.    Pt is [id number] with 87 year old daughter.  She still nurses.    Patient is a 23 y.o. female presenting with vomiting. The history is provided by the patient and the spouse.  Emesis Severity:  Moderate Timing:  Intermittent Number of daily episodes:  4 times Quality:  Undigested food Able to tolerate:  Liquids How soon after eating does vomiting occur:  20 minutes (Last meal breakfast 0800. Drank milk.  ) Chronicity:  New Recent urination:  Normal Context: not post-tussive and not self-induced   Relieved by:  None tried Worsened by:  Nothing tried Ineffective treatments:  None tried Associated symptoms: abdominal pain   Associated symptoms: no arthralgias, no chills, no cough, no diarrhea, no fever, no headaches, no myalgias, no sore throat and no URI   Risk factors: no alcohol use, no diabetes, not pregnant now, no prior abdominal surgery, no sick contacts, no suspect food intake and no travel to endemic areas     Past Medical History  Diagnosis Date  . GERD (gastroesophageal reflux disease)    Past Surgical History  Procedure Laterality Date  . No past surgeries     Family History  Problem Relation Age of Onset  . Anesthesia problems Neg Hx   . Hypotension Neg Hx   . Malignant hyperthermia Neg Hx   .  Pseudochol deficiency Neg Hx   . Other Neg Hx    History  Substance Use Topics  . Smoking status: Never Smoker   . Smokeless tobacco: Never Used  . Alcohol Use: No   OB History   Grav Para Term Preterm Abortions TAB SAB Ect Mult Living   1 1 1  0 0 0 0 0 0 1     Review of Systems  Constitutional: Negative for fever, chills, diaphoresis, activity change, appetite change, fatigue and unexpected weight change.  HENT: Negative.  Negative for sore throat.   Eyes: Negative.   Respiratory: Negative.  Negative for apnea, cough, choking, chest tightness, shortness of breath, wheezing and stridor.   Gastrointestinal: Positive for nausea, vomiting and abdominal pain. Negative for diarrhea, constipation, blood in stool, abdominal distention, anal bleeding and rectal pain.  Endocrine: Negative.   Genitourinary: Negative.  Negative for dysuria, urgency, frequency, hematuria, flank pain, decreased urine volume, vaginal bleeding, vaginal discharge, enuresis, difficulty urinating, genital sores, vaginal pain, menstrual problem, pelvic pain and dyspareunia.  Musculoskeletal: Negative for arthralgias and myalgias.  Neurological: Negative for headaches.       Pt reports burning to bilat legs and itching has improved.      Allergies  Review of patient's allergies indicates no known allergies.  Home Medications   Current Outpatient Rx  Name  Route  Sig  Dispense  Refill  . diphenhydrAMINE (BENADRYL)  25 MG tablet   Oral   Take 50 mg by mouth every 4 (four) hours as needed for itching.         . naproxen (NAPROSYN) 375 MG tablet   Oral   Take 1 tablet (375 mg total) by mouth 2 (two) times daily.   20 tablet   0   . ondansetron (ZOFRAN) 4 MG tablet   Oral   Take 1 tablet (4 mg total) by mouth every 6 (six) hours.   12 tablet   0   . sulfamethoxazole-trimethoprim (SEPTRA DS) 800-160 MG per tablet   Oral   Take 1 tablet by mouth every 12 (twelve) hours.   10 tablet   0   . traMADol  (ULTRAM) 50 MG tablet   Oral   Take 1 tablet (50 mg total) by mouth every 6 (six) hours as needed.   15 tablet   0    BP 109/86  Pulse 73  Temp(Src) 98.1 F (36.7 C) (Oral)  Resp 16  SpO2 100% Physical Exam  Nursing note and vitals reviewed. Constitutional: She appears well-developed and well-nourished.  HENT:  Head: Normocephalic and atraumatic.  Mouth/Throat: No oropharyngeal exudate.  Eyes: Conjunctivae are normal. Pupils are equal, round, and reactive to light. Right eye exhibits no discharge. Left eye exhibits no discharge.  Neck: Normal range of motion. Neck supple. No JVD present. No tracheal deviation present. No thyromegaly present.  Cardiovascular: Normal rate and regular rhythm.   Pulmonary/Chest: Effort normal and breath sounds normal. No stridor.  Abdominal: Soft. Bowel sounds are normal. She exhibits no shifting dullness, no distension, no pulsatile liver, no fluid wave, no abdominal bruit, no ascites, no pulsatile midline mass and no mass. There is no hepatosplenomegaly. There is generalized tenderness. There is no rigidity, no rebound, no guarding, no CVA tenderness, no tenderness at McBurney's point and negative Murphy's sign. No hernia.  Musculoskeletal: Normal range of motion. She exhibits no edema and no tenderness.  Neurological: She is alert.  Skin: Skin is warm and dry.  Psychiatric:  Anxious throughout ER stay.      ED Course  Procedures (including critical care time) Labs Review Labs Reviewed  CBC WITH DIFFERENTIAL - Abnormal; Notable for the following:    Neutrophils Relative % 80 (*)    All other components within normal limits  COMPREHENSIVE METABOLIC PANEL - Abnormal; Notable for the following:    Potassium 3.6 (*)    All other components within normal limits  URINALYSIS, ROUTINE W REFLEX MICROSCOPIC - Abnormal; Notable for the following:    APPearance CLOUDY (*)    pH 8.5 (*)    Ketones, ur >80 (*)    Protein, ur 30 (*)    Leukocytes, UA  LARGE (*)    All other components within normal limits  URINE MICROSCOPIC-ADD ON - Abnormal; Notable for the following:    Squamous Epithelial / LPF FEW (*)    Bacteria, UA MANY (*)    All other components within normal limits  URINE CULTURE  PREGNANCY, URINE   Imaging Review Mr Lumbar Spine Wo Contrast  03/13/2013   CLINICAL DATA:  Itching and burning sensation of the lower body.  EXAM: MRI LUMBAR SPINE WITHOUT CONTRAST  TECHNIQUE: Multiplanar, multisequence MR imaging was performed. No intravenous contrast was administered.  COMPARISON:  None.  FINDINGS: Alignment is normal. The discs are normal except for a minor disc bulge at L4-5. There is no herniation or stenosis. No osseous or articular pathology. The distal  cord and conus are normal with conus tip at L2. Paravertebral soft tissues appear normal.  IMPRESSION: Negative study. Minimal disc bulge L4-5. No cause of the described symptoms identified.   Electronically Signed   By: Paulina Fusi M.D.   On: 03/13/2013 19:36    EKG Interpretation   None      Results for orders placed during the hospital encounter of 03/14/13  CBC WITH DIFFERENTIAL      Result Value Range   WBC 7.7  4.0 - 10.5 K/uL   RBC 4.52  3.87 - 5.11 MIL/uL   Hemoglobin 14.3  12.0 - 15.0 g/dL   HCT 16.1  09.6 - 04.5 %   MCV 87.8  78.0 - 100.0 fL   MCH 31.6  26.0 - 34.0 pg   MCHC 36.0  30.0 - 36.0 g/dL   RDW 40.9  81.1 - 91.4 %   Platelets 239  150 - 400 K/uL   Neutrophils Relative % 80 (*) 43 - 77 %   Neutro Abs 6.1  1.7 - 7.7 K/uL   Lymphocytes Relative 15  12 - 46 %   Lymphs Abs 1.1  0.7 - 4.0 K/uL   Monocytes Relative 6  3 - 12 %   Monocytes Absolute 0.4  0.1 - 1.0 K/uL   Eosinophils Relative 0  0 - 5 %   Eosinophils Absolute 0.0  0.0 - 0.7 K/uL   Basophils Relative 0  0 - 1 %   Basophils Absolute 0.0  0.0 - 0.1 K/uL  COMPREHENSIVE METABOLIC PANEL      Result Value Range   Sodium 139  137 - 147 mEq/L   Potassium 3.6 (*) 3.7 - 5.3 mEq/L   Chloride 98   96 - 112 mEq/L   CO2 22  19 - 32 mEq/L   Glucose, Bld 99  70 - 99 mg/dL   BUN 10  6 - 23 mg/dL   Creatinine, Ser 7.82  0.50 - 1.10 mg/dL   Calcium 9.9  8.4 - 95.6 mg/dL   Total Protein 8.1  6.0 - 8.3 g/dL   Albumin 4.8  3.5 - 5.2 g/dL   AST 18  0 - 37 U/L   ALT 12  0 - 35 U/L   Alkaline Phosphatase 95  39 - 117 U/L   Total Bilirubin 1.0  0.3 - 1.2 mg/dL   GFR calc non Af Amer >90  >90 mL/min   GFR calc Af Amer >90  >90 mL/min  URINALYSIS, ROUTINE W REFLEX MICROSCOPIC      Result Value Range   Color, Urine YELLOW  YELLOW   APPearance CLOUDY (*) CLEAR   Specific Gravity, Urine 1.017  1.005 - 1.030   pH 8.5 (*) 5.0 - 8.0   Glucose, UA NEGATIVE  NEGATIVE mg/dL   Hgb urine dipstick NEGATIVE  NEGATIVE   Bilirubin Urine NEGATIVE  NEGATIVE   Ketones, ur >80 (*) NEGATIVE mg/dL   Protein, ur 30 (*) NEGATIVE mg/dL   Urobilinogen, UA 1.0  0.0 - 1.0 mg/dL   Nitrite NEGATIVE  NEGATIVE   Leukocytes, UA LARGE (*) NEGATIVE  PREGNANCY, URINE      Result Value Range   Preg Test, Ur NEGATIVE  NEGATIVE  URINE MICROSCOPIC-ADD ON      Result Value Range   Squamous Epithelial / LPF FEW (*) RARE   WBC, UA 3-6  <3 WBC/hpf   Bacteria, UA MANY (*) RARE     MDM   1. Abdominal pain  2. UTI (urinary tract infection)   3. Vomiting    23 year old female presents emergency department with chief complaint of nausea vomiting and diffuse nonspecific abdominal pain. This is the patient's third visit within the past several days, however this visit is not for her previous chief complaint of lower extremity burning and itching. Filed Vitals:   03/14/13 1930  BP: 109/86  Pulse: 73  Temp:   Resp:    Patient is afebrile, her vital signs are normal, her abdominal exam is benign. She denies vaginal discharge, abnormal vaginal bleeding, or pelvic pain. She also declines a pelvic exam. UA is consistent with a urinary tract infection. HCG negative.  Bloodwork negative.  At this point I do not believe the  patient needs further imaging to include ultrasound or CT. Symptoms improved during ER stay. Patient was hungry and requested to leave at approximately 1930. I lengthy discussion with the patient and her husband and both acknowledged discharge instructions. 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 Ultram may be contributing to the patient's symptoms and as a result I recommend she stop taking this medicine. ER precautions were given for increasing pain, fever, by mouth intolerance, or other concerns.  If pt develops a fever, worsening pain, or po intolerance imaging may be beneficial at that time.  VSS throughout ER stay.       Darlys Gales, MD 03/14/13 2051  Darlys Gales, MD 03/14/13 2056

## 2013-03-14 NOTE — ED Notes (Signed)
MD aware pt still in pain. Pt sent home with pain medication. MD at bedside for discharge. Pt and husband verbalized understanding of discharge instructions and prescriptions.

## 2013-03-15 LAB — URINE CULTURE: Colony Count: 2000

## 2013-03-15 NOTE — ED Provider Notes (Signed)
Medical screening examination/treatment/procedure(s) were performed by non-physician practitioner and as supervising physician I was immediately available for consultation/collaboration.  EKG Interpretation   None         Shelda JakesScott W. Tywaun Hiltner, MD 03/15/13 [phone]

## 2013-03-16 ENCOUNTER — Emergency Department (HOSPITAL_COMMUNITY)
Admission: EM | Admit: 2013-03-16 | Discharge: 2013-03-16 | Disposition: A | Discharge status: Home / Self Care | Payer: BC Managed Care – PPO | Attending: Emergency Medicine | Admitting: Emergency Medicine

## 2013-03-16 ENCOUNTER — Encounter (HOSPITAL_COMMUNITY): Payer: Self-pay | Admitting: Emergency Medicine

## 2013-03-16 DIAGNOSIS — Z791 Long term (current) use of non-steroidal anti-inflammatories (NSAID): Secondary | ICD-10-CM | POA: Insufficient documentation

## 2013-03-16 DIAGNOSIS — M791 Myalgia, unspecified site: Secondary | ICD-10-CM

## 2013-03-16 DIAGNOSIS — IMO0001 Reserved for inherently not codable concepts without codable children: Secondary | ICD-10-CM | POA: Insufficient documentation

## 2013-03-16 DIAGNOSIS — N390 Urinary tract infection, site not specified: Secondary | ICD-10-CM | POA: Insufficient documentation

## 2013-03-16 DIAGNOSIS — Z8719 Personal history of other diseases of the digestive system: Secondary | ICD-10-CM | POA: Insufficient documentation

## 2013-03-16 HISTORY — DX: Dorsalgia, unspecified: M54.9

## 2013-03-16 MED ORDER — MORPHINE SULFATE 4 MG/ML IJ SOLN
4.0000 mg | Freq: Once | INTRAMUSCULAR | Status: AC
  Administered 2013-03-16: 4 mg via INTRAVENOUS
  Filled 2013-03-16 (×2): qty 1

## 2013-03-16 MED ORDER — ONDANSETRON HCL 4 MG/2ML IJ SOLN
4.0000 mg | Freq: Once | INTRAMUSCULAR | Status: AC
  Administered 2013-03-16: 4 mg via INTRAVENOUS
  Filled 2013-03-16: qty 2

## 2013-03-16 MED ORDER — KETOROLAC TROMETHAMINE 30 MG/ML IJ SOLN: 30.0000 mg | Freq: Once | INTRAMUSCULAR | Status: DC

## 2013-03-16 MED ORDER — SODIUM CHLORIDE 0.9 % IV BOLUS (SEPSIS)
1000.0000 mL | Freq: Once | INTRAVENOUS | Status: AC
  Administered 2013-03-16: 1000 mL via INTRAVENOUS

## 2013-03-16 MED ORDER — HYDROCODONE-ACETAMINOPHEN 5-325 MG PO TABS: 1.0000 | ORAL_TABLET | Freq: Four times a day (QID) | ORAL | Status: DC | PRN

## 2013-03-16 MED ORDER — KETOROLAC TROMETHAMINE 30 MG/ML IJ SOLN
30.0000 mg | Freq: Once | INTRAMUSCULAR | Status: AC
  Administered 2013-03-16: 30 mg via INTRAVENOUS
  Filled 2013-03-16: qty 1

## 2013-03-16 NOTE — ED Provider Notes (Signed)
Medical screening examination/treatment/procedure(s) were performed by non-physician practitioner and as supervising physician I was immediately available for consultation/collaboration.    Avonne Berkery M Luisdavid Hamblin, MD 03/16/13 0527 

## 2013-03-16 NOTE — ED Notes (Signed)
Pt c/o lower back pain for the past 4 days-[facility]-states Naprosyn not helping

## 2013-03-16 NOTE — ED Provider Notes (Signed)
CSN: [id number]     Arrival date & time 03/16/13  0111 History   First MD Initiated Contact with Patient 03/16/13 0148     Chief Complaint  Patient presents with  . Back Pain   (Consider location/radiation/quality/duration/timing/severity/associated sxs/prior Treatment) HPI Comments: For the past 5 days.  Patient has generalized myalgias, and body aches, not relieved by, dull.  She was seen 3 days ago, diagnosed with UTI, given a prescription for Septra, which she's been taking, as well as Naprosyn, which she's been taking without any relief.  Patient denies any nausea, vomiting, diarrhea, constipation, fever, chills, shortness of breath, rash  Patient is a 23 y.o. female presenting with back pain. The history is provided by the patient.  Back Pain Location:  Generalized Pain severity:  Moderate Onset quality:  Gradual Duration:  5 days Timing:  Constant Progression:  Worsening Chronicity:  New Context: not emotional stress, not falling, not lifting heavy objects, not MCA, not physical stress, not recent illness, not recent injury and not twisting   Relieved by:  Nothing Worsened by:  Nothing tried Ineffective treatments:  NSAIDs (Tramadol) Associated symptoms: abdominal swelling   Associated symptoms: no abdominal pain, no bladder incontinence, no bowel incontinence, no chest pain, no dysuria, no fever, no headaches, no paresthesias, no pelvic pain, no perianal numbness, no tingling and no weakness     Past Medical History  Diagnosis Date  . GERD (gastroesophageal reflux disease)   . Back pain    Past Surgical History  Procedure Laterality Date  . No past surgeries     Family History  Problem Relation Age of Onset  . Anesthesia problems Neg Hx   . Hypotension Neg Hx   . Malignant hyperthermia Neg Hx   . Pseudochol deficiency Neg Hx   . Other Neg Hx    History  Substance Use Topics  . Smoking status: Never Smoker   . Smokeless tobacco: Never Used  . Alcohol Use: No    OB History   Grav Para Term Preterm Abortions TAB SAB Ect Mult Living   1 1 1  0 0 0 0 0 0 1     Review of Systems  Constitutional: Negative for fever and chills.  Cardiovascular: Negative for chest pain.  Gastrointestinal: Negative for abdominal pain and bowel incontinence.  Genitourinary: Negative for bladder incontinence, dysuria, flank pain and pelvic pain.  Musculoskeletal: Positive for back pain and myalgias.  Skin: Negative for rash.  Neurological: Negative for dizziness, tingling, weakness, headaches and paresthesias.    Allergies  Review of patient's allergies indicates no known allergies.  Home Medications   Current Outpatient Rx  Name  Route  Sig  Dispense  Refill  . diphenhydrAMINE (BENADRYL) 25 MG tablet   Oral   Take 25-50 mg by mouth every 4 (four) hours as needed for itching.          . naproxen (NAPROSYN) 375 MG tablet   Oral   Take 1 tablet (375 mg total) by mouth 2 (two) times daily.   20 tablet   0   . sulfamethoxazole-trimethoprim (SEPTRA DS) 800-160 MG per tablet   Oral   Take 1 tablet by mouth every 12 (twelve) hours.   10 tablet   0   . HYDROcodone-acetaminophen (NORCO/VICODIN) 5-325 MG per tablet   Oral   Take 1 tablet by mouth every 6 (six) hours as needed.   10 tablet   0   . ondansetron (ZOFRAN) 4 MG tablet   Oral  Take 1 tablet (4 mg total) by mouth every 6 (six) hours.   12 tablet   0   . traMADol (ULTRAM) 50 MG tablet   Oral   Take 1 tablet (50 mg total) by mouth every 6 (six) hours as needed.   15 tablet   0    BP 120/93  Pulse 94  Temp(Src) 98.8 F (37.1 C) (Oral)  Resp 20  Ht 5\' 4"  (1.626 m)  Wt 135 lb (61.236 kg)  BMI 23.16 kg/m2  SpO2 98% Physical Exam  Nursing note and vitals reviewed. Constitutional: She is oriented to person, place, and time. She appears well-developed and well-nourished.  HENT:  Head: Normocephalic.  Mouth/Throat: Oropharynx is clear and moist.  Eyes: Pupils are equal, round, and  reactive to light.  Cardiovascular: Normal rate and regular rhythm.   Pulmonary/Chest: Effort normal and breath sounds normal.  Abdominal: Soft. Bowel sounds are normal.  Musculoskeletal: Normal range of motion. She exhibits no edema and no tenderness.  Neurological: She is alert and oriented to person, place, and time.  Skin: Skin is warm. No rash noted. No erythema.    ED Course  Procedures (including critical care time) Labs Review Labs Reviewed - No data to display Imaging Review No results found.  EKG Interpretation   None       MDM   1. UTI (lower urinary tract infection)   2. Myalgia     Feels significantly better after IV fluids Toradol and Morphine     Arman Filter, NP 03/16/13 [phone]

## 2013-03-16 NOTE — ED Notes (Signed)
Bed: WA14 Expected date:  Expected time:  Means of arrival:  Comments: EMS-back pain 

## 2013-04-10 ENCOUNTER — Ambulatory Visit (INDEPENDENT_AMBULATORY_CARE_PROVIDER_SITE_OTHER): Payer: BC Managed Care – PPO | Admitting: Emergency Medicine

## 2013-04-10 VITALS — BP 110/78 | HR 81 | Temp 98.0°F | Resp 16 | Ht 63.5 in | Wt 132.0 lb

## 2013-04-10 DIAGNOSIS — L309 Dermatitis, unspecified: Secondary | ICD-10-CM

## 2013-04-10 DIAGNOSIS — L259 Unspecified contact dermatitis, unspecified cause: Secondary | ICD-10-CM

## 2013-04-10 NOTE — Patient Instructions (Signed)
Eczema Eczema, also called atopic dermatitis, is a skin disorder that causes inflammation of the skin. It causes a red rash and dry, scaly skin. The skin becomes very itchy. Eczema is generally worse during the cooler winter months and often improves with the warmth of summer. Eczema usually starts showing signs in infancy. Some children outgrow eczema, but it may last through adulthood.  CAUSES  The exact cause of eczema is not known, but it appears to run in families. People with eczema often have a family history of eczema, allergies, asthma, or hay fever. Eczema is not contagious. Flare-ups of the condition may be caused by:   Contact with something you are sensitive or allergic to.   Stress. SIGNS AND SYMPTOMS  Dry, scaly skin.   Red, itchy rash.   Itchiness. This may occur before the skin rash and may be very intense.  DIAGNOSIS  The diagnosis of eczema is usually made based on symptoms and medical history. TREATMENT  Eczema cannot be cured, but symptoms usually can be controlled with treatment and other strategies. A treatment plan might include:  Controlling the itching and scratching.   Use over-the-counter antihistamines as directed for itching. This is especially useful at night when the itching tends to be worse.   Use over-the-counter steroid creams as directed for itching.   Avoid scratching. Scratching makes the rash and itching worse. It may also result in a skin infection (impetigo) due to a break in the skin caused by scratching.   Keeping the skin well moisturized with creams every day. This will seal in moisture and help prevent dryness. Lotions that contain alcohol and water should be avoided because they can dry the skin.   Limiting exposure to things that you are sensitive or allergic to (allergens).   Recognizing situations that cause stress.   Developing a plan to manage stress.  HOME CARE INSTRUCTIONS   Only take over-the-counter or  prescription medicines as directed by your health care provider.   Do not use anything on the skin without checking with your health care provider.   Keep baths or showers short (5 minutes) in warm (not hot) water. Use mild cleansers for bathing. These should be unscented. You may add nonperfumed bath oil to the bath water. It is best to avoid soap and bubble bath.   Immediately after a bath or shower, when the skin is still damp, apply a moisturizing ointment to the entire body. This ointment should be a petroleum ointment. This will seal in moisture and help prevent dryness. The thicker the ointment, the better. These should be unscented.   Keep fingernails cut short. Children with eczema may need to wear soft gloves or mittens at night after applying an ointment.   Dress in clothes made of cotton or cotton blends. Dress lightly, because heat increases itching.   A child with eczema should stay away from anyone with fever blisters or cold sores. The virus that causes fever blisters (herpes simplex) can cause a serious skin infection in children with eczema. SEEK MEDICAL CARE IF:   Your itching interferes with sleep.   Your rash gets worse or is not better within 1 week after starting treatment.   You see pus or soft yellow scabs in the rash area.   You have a fever.   You have a rash flare-up after contact with someone who has fever blisters.  Document Released: 02/17/2000 Document Revised: 12/10/2012 Document Reviewed: 09/22/2012 [employer] Patient Information 2014 [employer].  

## 2013-04-10 NOTE — Progress Notes (Signed)
[facility] [address], [place] [address] [phone]  Date:  04/10/2013   Name:  [name]   DOB:  06/12/1990   MRN:  [id number]  PCP:  No PCP Per Patient    Chief Complaint: Allergic Reaction and Tingling   History of Present Illness:  [name] is a 23 y.o. very pleasant female patient who presents with the following:  Multiple ER visits with extensive lab workup and numerous medications tried with no improvement. Continues to complain of generalized itching of trunk, face and extremities. Uses [place] soap.  No new allergen exposure.  No fever or chills.  No nausea or vomiting, cough or coryza.  No improvement with over the counter medications or other home remedies. Denies other complaint or health concern today.   Patient Active Problem List   Diagnosis Date Noted  . Active labor at term 03/13/2012  . SVD (spontaneous vaginal delivery) 03/13/2012    Past Medical History  Diagnosis Date  . GERD (gastroesophageal reflux disease)   . Back pain     Past Surgical History  Procedure Laterality Date  . No past surgeries      History  Substance Use Topics  . Smoking status: Never Smoker   . Smokeless tobacco: Never Used  . Alcohol Use: No    Family History  Problem Relation Age of Onset  . Anesthesia problems Neg Hx   . Hypotension Neg Hx   . Malignant hyperthermia Neg Hx   . Pseudochol deficiency Neg Hx   . Other Neg Hx     No Known Allergies  Medication list has been reviewed and updated.  Current Outpatient Prescriptions on File Prior to Visit  Medication Sig Dispense Refill  . diphenhydrAMINE (BENADRYL) 25 MG tablet Take 25-50 mg by mouth every 4 (four) hours as needed for itching.       Marland Kitchen. HYDROcodone-acetaminophen (NORCO/VICODIN) 5-325 MG per tablet Take 1 tablet by mouth every 6 (six) hours as needed.  10 tablet  0  . naproxen (NAPROSYN) 375 MG tablet Take 1 tablet (375 mg total) by mouth 2 (two) times daily.  20 tablet   0  . ondansetron (ZOFRAN) 4 MG tablet Take 1 tablet (4 mg total) by mouth every 6 (six) hours.  12 tablet  0  . sulfamethoxazole-trimethoprim (SEPTRA DS) 800-160 MG per tablet Take 1 tablet by mouth every 12 (twelve) hours.  10 tablet  0  . traMADol (ULTRAM) 50 MG tablet Take 1 tablet (50 mg total) by mouth every 6 (six) hours as needed.  15 tablet  0   No current facility-administered medications on file prior to visit.    Review of Systems:  As per HPI, otherwise negative.    Physical Examination: Filed Vitals:   04/10/13 1800  BP: 110/78  Pulse: 81  Temp: 98 F (36.7 C)  Resp: 16   Filed Vitals:   04/10/13 1800  Height: 5' 3.5" (1.613 m)  Weight: 132 lb (59.875 kg)   Body mass index is 23.01 kg/(m^2). Ideal Body Weight: Weight in (lb) to have BMI = 25: 143.1   GEN: WDWN, NAD, Non-toxic, Alert & Oriented x 3 HEENT: Atraumatic, Normocephalic.  Ears and Nose: No external deformity. EXTR: No clubbing/cyanosis/edema NEURO: Normal gait.  PSYCH: Normally interactive. Conversant. Not depressed or anxious appearing.  Calm demeanor.  SKIN;  severly dry skin with eczema   Assessment and Plan: Eczema Stop [place] spring Use dove Use eucerin lotion Derm consult  Signed,  Ellison Carwin, MD

## 2013-06-18 ENCOUNTER — Ambulatory Visit (INDEPENDENT_AMBULATORY_CARE_PROVIDER_SITE_OTHER): Payer: BC Managed Care – PPO | Admitting: Family Medicine

## 2013-06-18 VITALS — BP 104/68 | HR 64 | Temp 98.6°F | Resp 16 | Ht 64.5 in | Wt 135.0 lb

## 2013-06-18 DIAGNOSIS — Z113 Encounter for screening for infections with a predominantly sexual mode of transmission: Secondary | ICD-10-CM

## 2013-06-18 DIAGNOSIS — R599 Enlarged lymph nodes, unspecified: Secondary | ICD-10-CM

## 2013-06-18 DIAGNOSIS — R591 Generalized enlarged lymph nodes: Secondary | ICD-10-CM

## 2013-06-18 LAB — POCT CBC
Granulocyte percent: 43.3 %G (ref 37–80)
HCT, POC: 39.2 % (ref 37.7–47.9)
Hemoglobin: 12.3 g/dL (ref 12.2–16.2)
Lymph, poc: 2.4 (ref 0.6–3.4)
MCH, POC: 30.2 pg (ref 27–31.2)
MCHC: 31.4 g/dL — AB (ref 31.8–35.4)
MCV: 96.2 fL (ref 80–97)
MID (cbc): 0.4 (ref 0–0.9)
MPV: 9.1 fL (ref 0–99.8)
POC Granulocyte: 2.1 (ref 2–6.9)
POC LYMPH PERCENT: 49.1 % (ref 10–50)
POC MID %: 7.6 % (ref 0–12)
Platelet Count, POC: 210 10*3/uL (ref 142–424)
RBC: 4.07 M/uL (ref 4.04–5.48)
RDW, POC: 12.6 %
WBC: 4.9 10*3/uL (ref 4.6–10.2)

## 2013-06-18 NOTE — Progress Notes (Signed)
 Chief Complaint:  Chief Complaint  Patient presents with  . Mass    right side of neck x 2 days    HPI: [name] is a 23 y.o. female who is here for 2 day history of right sided LAD swelling. NO fevers, chills, night sweat pr unintentional weightloss. She has has no URI sxs. She has a slightl cough but attributed to allergies maybe.  She deneis any immuno compromise , no HIV.   Past Medical History  Diagnosis Date  . GERD (gastroesophageal reflux disease)   . Back pain    Past Surgical History  Procedure Laterality Date  . No past surgeries     History   Social History  . Marital Status: Married    Spouse Name: N/A    Number of Children: N/A  . Years of Education: N/A   Social History Main Topics  . Smoking status: Never Smoker   . Smokeless tobacco: Never Used  . Alcohol Use: No  . Drug Use: No  . Sexual Activity: Yes    Birth Control/ Protection: None   Other Topics Concern  . None   Social History Narrative  . None   Family History  Problem Relation Age of Onset  . Anesthesia problems Neg Hx   . Hypotension Neg Hx   . Malignant hyperthermia Neg Hx   . Pseudochol deficiency Neg Hx   . Other Neg Hx    No Known Allergies Prior to Admission medications   Medication Sig Start Date End Date Taking? Authorizing Provider  diphenhydrAMINE (BENADRYL) 25 MG tablet Take 25-50 mg by mouth every 4 (four) hours as needed for itching.     Historical Provider, MD  HYDROcodone-acetaminophen (NORCO/VICODIN) 5-325 MG per tablet Take 1 tablet by mouth every 6 (six) hours as needed. 03/16/13   Arman Filter, NP  naproxen (NAPROSYN) 375 MG tablet Take 1 tablet (375 mg total) by mouth 2 (two) times daily. 03/14/13   Darlys Gales, MD  ondansetron (ZOFRAN) 4 MG tablet Take 1 tablet (4 mg total) by mouth every 6 (six) hours. 03/14/13   Darlys Gales, MD  sulfamethoxazole-trimethoprim (SEPTRA DS) 800-160 MG per tablet Take 1 tablet by mouth every 12 (twelve) hours. 03/14/13    Darlys Gales, MD  traMADol (ULTRAM) 50 MG tablet Take 1 tablet (50 mg total) by mouth every 6 (six) hours as needed. 03/13/13   Roxy Horseman, PA-C     ROS: The patient denies fevers, chills, night sweats, unintentional weight loss, chest pain, palpitations, wheezing, dyspnea on exertion, nausea, vomiting, abdominal pain, dysuria, hematuria, melena, numbness, weakness, or tingling.   All other systems have been reviewed and were otherwise negative with the exception of those mentioned in the HPI and as above.    PHYSICAL EXAM: Filed Vitals:   06/18/13 1953  BP: 104/68  Pulse: 64  Temp: 98.6 F (37 C)  Resp: 16   Filed Vitals:   06/18/13 1953  Height: 5' 4.5" (1.638 m)  Weight: 135 lb (61.236 kg)   Body mass index is 22.82 kg/(m^2).  General: Alert, no acute distress HEENT:  Normocephalic, atraumatic, oropharynx patent. EOMI, PERRLA, TM nl, no exudates Cardiovascular:  Regular rate and rhythm, no rubs murmurs or gallops.  No Carotid bruits, radial pulse intact. No pedal edema.  Respiratory: Clear to auscultation bilaterally.  No wheezes, rales, or rhonchi.  No cyanosis, no use of accessory musculature GI: No organomegaly, abdomen is soft and non-tender, positive bowel sounds.  No masses. Skin: No rashes. Neurologic: Facial musculature symmetric. Psychiatric: Patient is appropriate throughout our interaction. Lymphatic: + right sided cervical lymphadenopathy x 2, also right sided post auricular LAD Musculoskeletal: Gait intact.   LABS: Results for orders placed during the hospital encounter of 03/14/13  URINE CULTURE      Result Value Ref Range   Specimen Description URINE, CLEAN CATCH     Special Requests NONE     Culture  Setup Time       Value: 03/14/2013 21:30     Performed at [facility]   Colony Count       Value: 2,000 COLONIES/ML     Performed at [facility]   Culture       Value: INSIGNIFICANT GROWTH     Performed at [facility]    Report Status 03/15/2013 FINAL    CBC WITH DIFFERENTIAL      Result Value Ref Range   WBC 7.7  4.0 - 10.5 K/uL   RBC 4.52  3.87 - 5.11 MIL/uL   Hemoglobin 14.3  12.0 - 15.0 g/dL   HCT 40.939.7  81.136.0 - 91.446.0 %   MCV 87.8  78.0 - 100.0 fL   MCH 31.6  26.0 - 34.0 pg   MCHC 36.0  30.0 - 36.0 g/dL   RDW 78.212.1  95.611.5 - 21.315.5 %   Platelets 239  150 - 400 K/uL   Neutrophils Relative % 80 (*) 43 - 77 %   Neutro Abs 6.1  1.7 - 7.7 K/uL   Lymphocytes Relative 15  12 - 46 %   Lymphs Abs 1.1  0.7 - 4.0 K/uL   Monocytes Relative 6  3 - 12 %   Monocytes Absolute 0.4  0.1 - 1.0 K/uL   Eosinophils Relative 0  0 - 5 %   Eosinophils Absolute 0.0  0.0 - 0.7 K/uL   Basophils Relative 0  0 - 1 %   Basophils Absolute 0.0  0.0 - 0.1 K/uL  COMPREHENSIVE METABOLIC PANEL      Result Value Ref Range   Sodium 139  137 - 147 mEq/L   Potassium 3.6 (*) 3.7 - 5.3 mEq/L   Chloride 98  96 - 112 mEq/L   CO2 22  19 - 32 mEq/L   Glucose, Bld 99  70 - 99 mg/dL   BUN 10  6 - 23 mg/dL   Creatinine, Ser 0.860.53  0.50 - 1.10 mg/dL   Calcium 9.9  8.4 - 57.810.5 mg/dL   Total Protein 8.1  6.0 - 8.3 g/dL   Albumin 4.8  3.5 - 5.2 g/dL   AST 18  0 - 37 U/L   ALT 12  0 - 35 U/L   Alkaline Phosphatase 95  39 - 117 U/L   Total Bilirubin 1.0  0.3 - 1.2 mg/dL   GFR calc non Af Amer >90  >90 mL/min   GFR calc Af Amer >90  >90 mL/min  URINALYSIS, ROUTINE W REFLEX MICROSCOPIC      Result Value Ref Range   Color, Urine YELLOW  YELLOW   APPearance CLOUDY (*) CLEAR   Specific Gravity, Urine 1.017  1.005 - 1.030   pH 8.5 (*) 5.0 - 8.0   Glucose, UA NEGATIVE  NEGATIVE mg/dL   Hgb urine dipstick NEGATIVE  NEGATIVE   Bilirubin Urine NEGATIVE  NEGATIVE   Ketones, ur >80 (*) NEGATIVE mg/dL   Protein, ur 30 (*) NEGATIVE mg/dL   Urobilinogen, UA 1.0  0.0 - 1.0 mg/dL   Nitrite NEGATIVE  NEGATIVE   ukocytes, UA LARGE (*) NEGATIVE  PREGNANCY, URINE      Result Value Ref Range   Preg Test, Ur NEGATIVE  NEGATIVE  URINE MICROSCOPIC-ADD ON       Result Value Ref Range   Squamous Epithelial / LPF FEW (*) RARE   WBC, UA 3-6  <3 WBC/hpf   Bacteria, UA MANY (*) RARE     EKG/XRAY:   Primary read [facility]   ASSESSMENT/PLAN: Encounter Diagnosis  Name Primary?  Marland Kitchen. LAD (lymphadenopathy) Yes   Rx amoxacillin IF no improvement after 5-7 days then return for followup I think this is just viral LAD  HIV test pending Call with results [phone]  Gross sideeffects, risk and benefits, and alternatives of medications d/w patient. Patient is aware that all medications have potential sideeffects and we are unable to predict every sideeffect or drug-drug interaction that may occur.  nell Antuhao P , DO 06/18/2013 8:09 PM  06/22/13-called with test results.

## 2013-06-19 LAB — HIV ANTIBODY (ROUTINE TESTING W REFLEX): HIV 1&2 Ab, 4th Generation: NONREACTIVE

## 2013-08-19 IMAGING — US US ABDOMEN COMPLETE
1 series · 14 of 25 positions shown · non-contrast
Comparison: No comparison studies available.

CLINICAL DATA: Upper abdominal pain with nausea vomiting.

ABDOMEN ULTRASOUND
TECHNIQUE: Complete abdominal ultrasound examination was performed
including evaluation of the liver, gallbladder, bile ducts,
pancreas, kidneys, spleen, IVC, and abdominal aorta.

[Series 1: us abdomen complete · 14 of 75 slices shown]
[im 1/75]
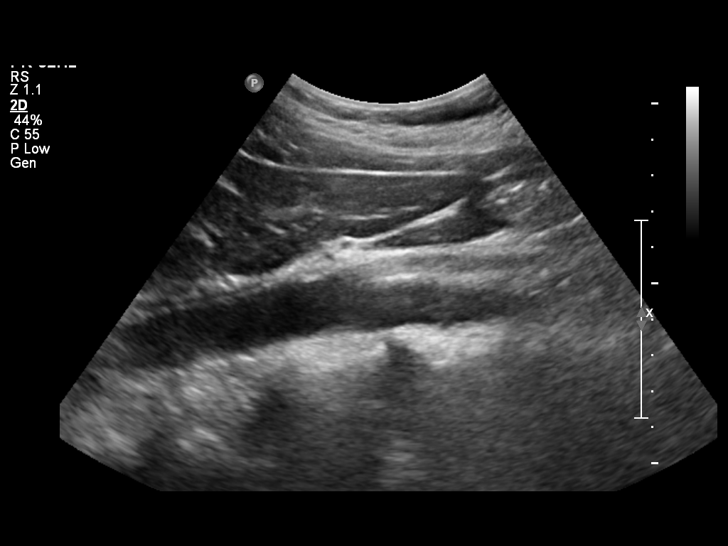
[im 7/75]
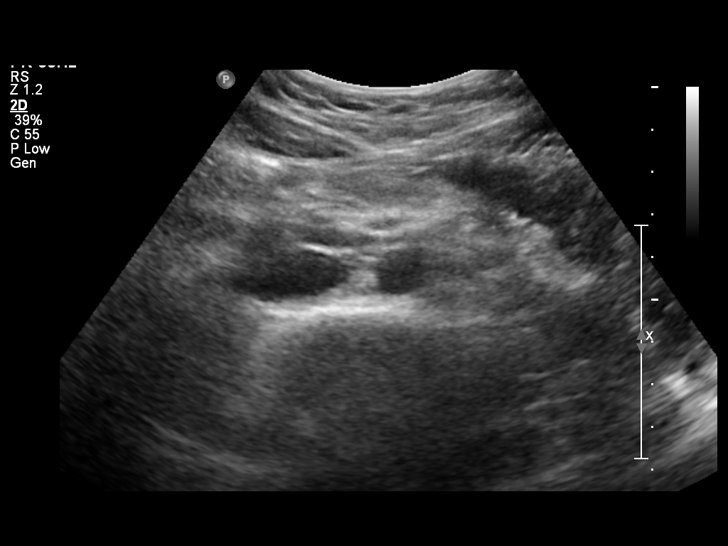
[im 13/75]
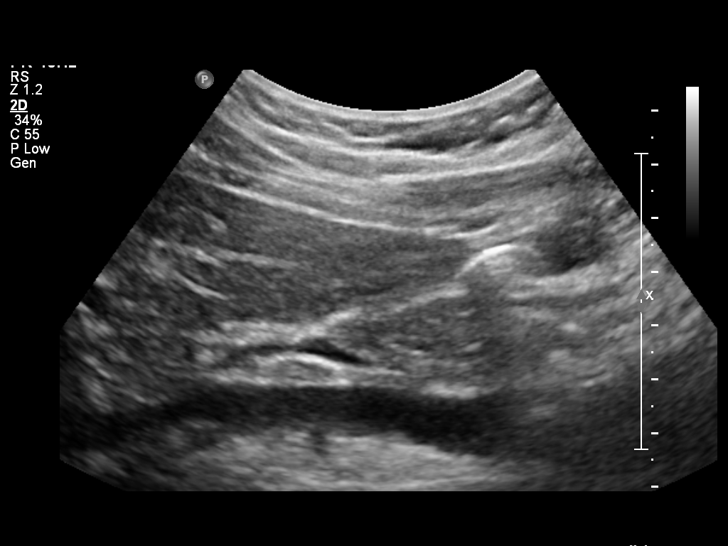
[im 19/75]
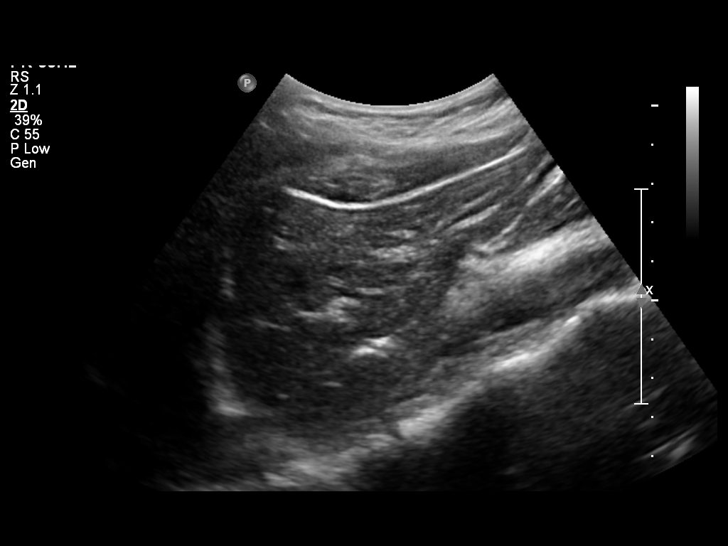
[im 25/75]
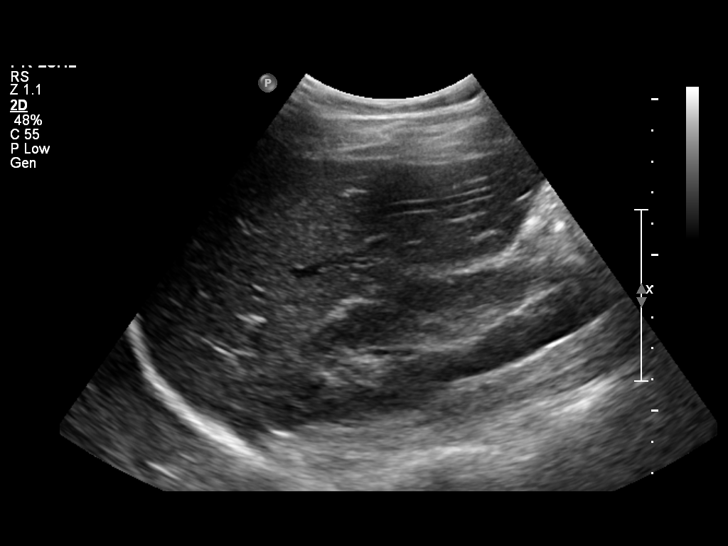
[im 28/75]
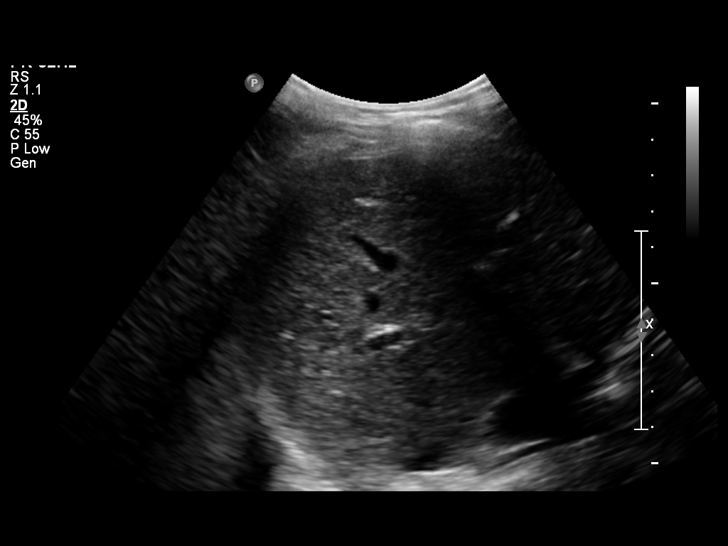
[im 34/75]
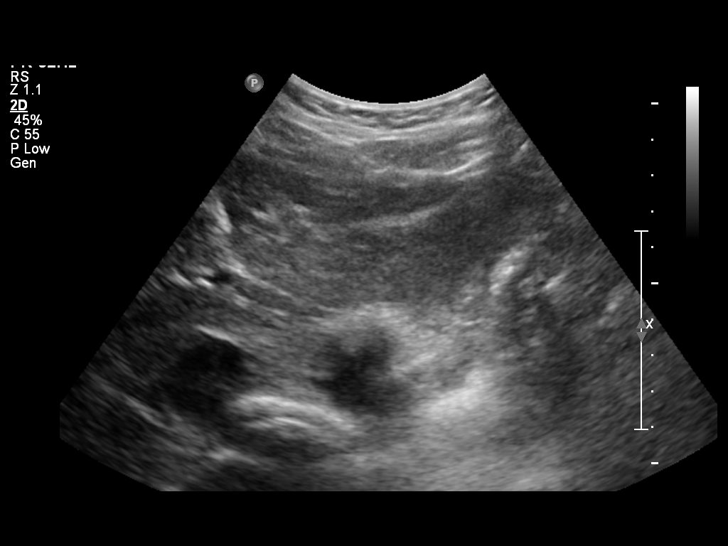
[im 41/75]
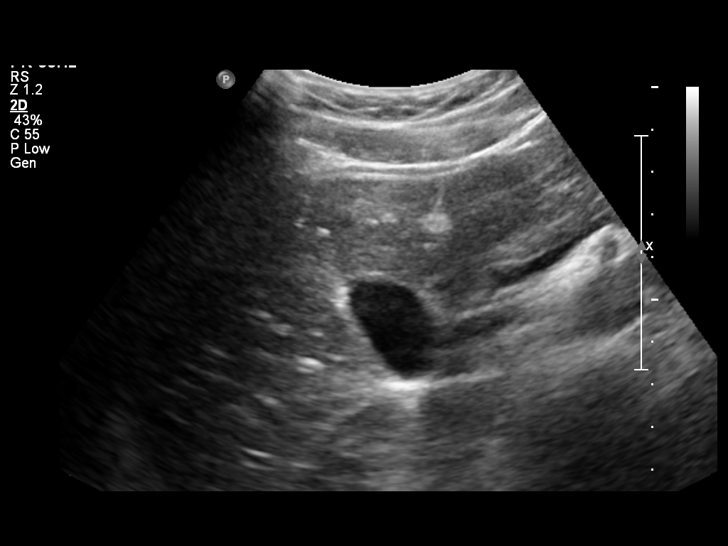
[im 47/75]
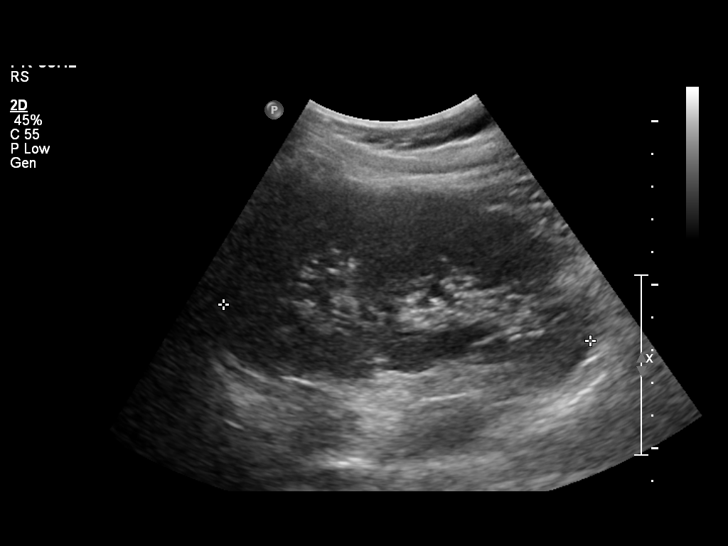
[im 50/75]
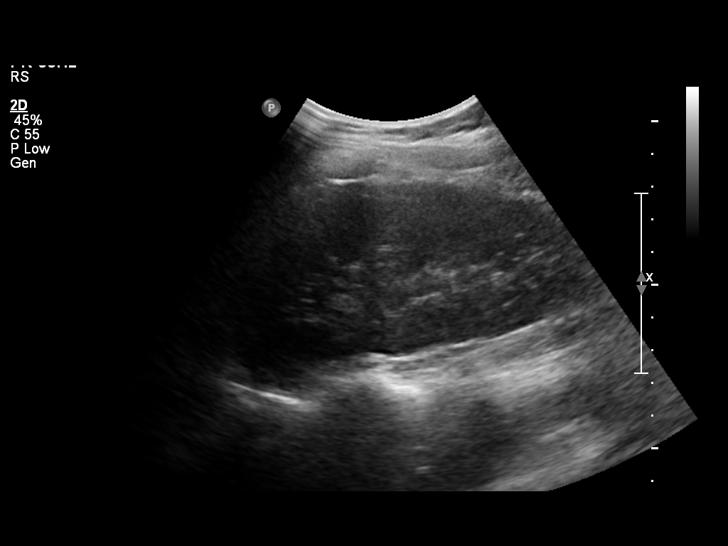
[im 56/75]
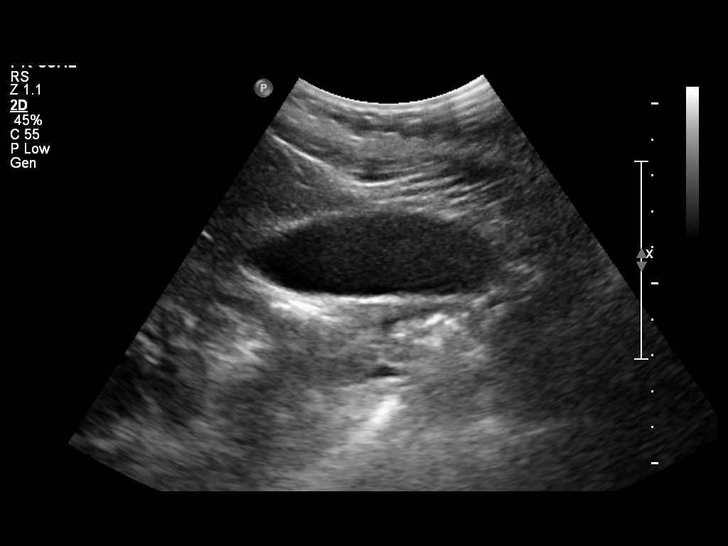
[im 62/75]
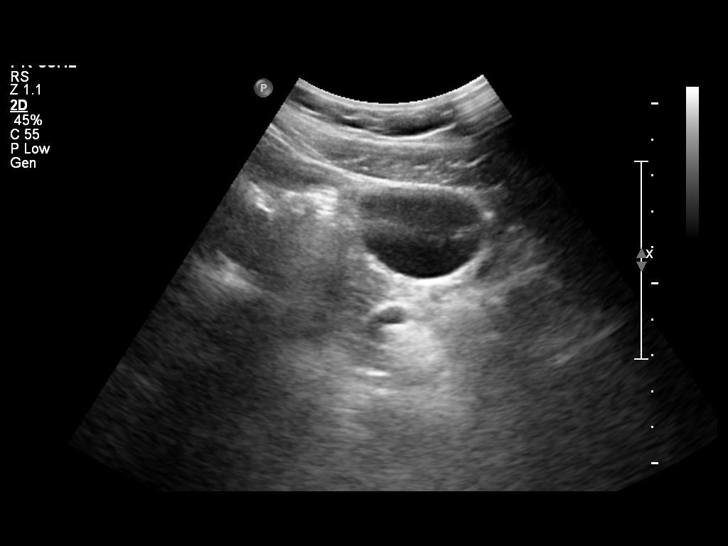
[im 68/75]
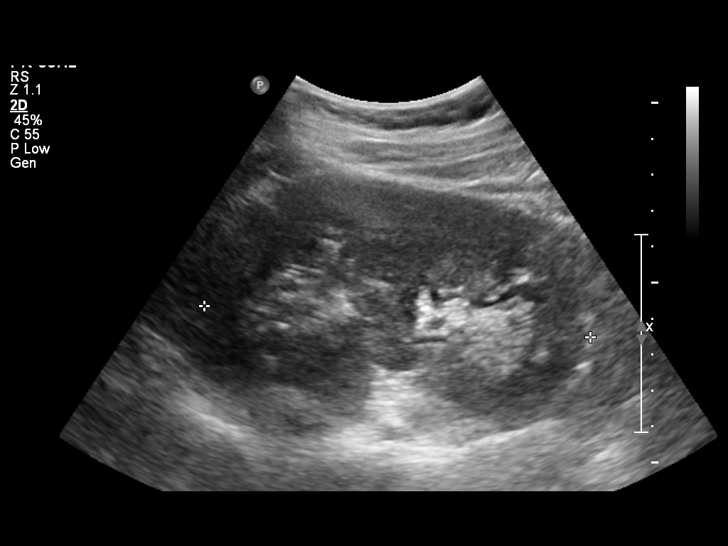
[im 75/75]
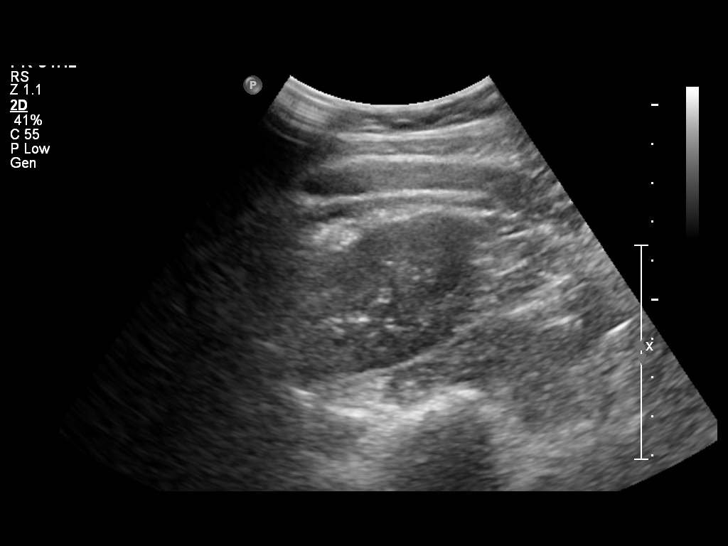

[14 of 25 positions shown; findings below may reference images not displayed]

FINDINGS: Gallbladder:  There is no evidence for gallstones.  No gallbladder
wall thickening or pericholecystic fluid.  The sonographer reports
no sonographic Murphy's sign.

Common Bile Duct:  Nondilated at 3 mm diameter.

Liver:  Normal.  No focal parenchymal abnormality.  No biliary
dilation.

IVC:  Normal.

Pancreas:  Normal.

Spleen:  Normal.

Right kidney:  11.3 cm in long axis.  Normal.

Left kidney:  10.8 cm in long axis.  Normal.

Abdominal Aorta:  No aneurysm.
IMPRESSION: Normal abdominal ultrasound.

## 2014-01-30 ENCOUNTER — Ambulatory Visit (INDEPENDENT_AMBULATORY_CARE_PROVIDER_SITE_OTHER): Payer: BC Managed Care – PPO | Admitting: Family Medicine

## 2014-01-30 VITALS — BP 104/62 | HR 67 | Temp 97.5°F | Resp 19 | Ht 63.5 in | Wt 134.8 lb

## 2014-01-30 DIAGNOSIS — R5383 Other fatigue: Secondary | ICD-10-CM

## 2014-01-30 DIAGNOSIS — G4489 Other headache syndrome: Secondary | ICD-10-CM

## 2014-01-30 DIAGNOSIS — R110 Nausea: Secondary | ICD-10-CM

## 2014-01-30 DIAGNOSIS — M791 Myalgia, unspecified site: Secondary | ICD-10-CM

## 2014-01-30 LAB — POCT CBC
Granulocyte percent: 35.9 %G — AB (ref 37–80)
HCT, POC: 40.2 % (ref 37.7–47.9)
Hemoglobin: 13.2 g/dL (ref 12.2–16.2)
Lymph, poc: 2.2 (ref 0.6–3.4)
MCH, POC: 30.6 pg (ref 27–31.2)
MCHC: 33 g/dL (ref 31.8–35.4)
MCV: 92.7 fL (ref 80–97)
MID (cbc): 0.5 (ref 0–0.9)
MPV: 7.3 fL (ref 0–99.8)
POC Granulocyte: 1.5 — AB (ref 2–6.9)
POC LYMPH PERCENT: 51.6 % — AB (ref 10–50)
POC MID %: 12.5 %M — AB (ref 0–12)
Platelet Count, POC: 218 10*3/uL (ref 142–424)
RBC: 4.33 M/uL (ref 4.04–5.48)
RDW, POC: 13.2 %
WBC: 4.2 10*3/uL — AB (ref 4.6–10.2)

## 2014-01-30 LAB — COMPLETE METABOLIC PANEL WITHOUT GFR
Alkaline Phosphatase: 68 U/L (ref 39–117)
GFR, Est Non African American: >89 mL/min
Glucose, Bld: 86 mg/dL (ref 70–99)
Sodium: 138 meq/L (ref 135–145)
Total Bilirubin: 0.6 mg/dL (ref 0.2–1.2)

## 2014-01-30 LAB — COMPLETE METABOLIC PANEL WITH GFR
ALT: 22 U/L (ref 0–35)
AST: 23 U/L (ref 0–37)
Albumin: 4.4 g/dL (ref 3.5–5.2)
BUN: 9 mg/dL (ref 6–23)
CO2: 28 mEq/L (ref 19–32)
Calcium: 8.9 mg/dL (ref 8.4–10.5)
Chloride: 103 mEq/L (ref 96–112)
Creat: 0.61 mg/dL (ref 0.50–1.10)
GFR, Est African American: >89 mL/min
Potassium: 4 mEq/L (ref 3.5–5.3)
Total Protein: 6.9 g/dL (ref 6.0–8.3)

## 2014-01-30 LAB — POCT SEDIMENTATION RATE: POCT SED RATE: 8 mm/hr (ref 0–22)

## 2014-01-30 LAB — POCT URINE PREGNANCY: Preg Test, Ur: NEGATIVE

## 2014-01-30 MED ORDER — BUTALBITAL-APAP-CAFFEINE 50-325-40 MG PO TABS: 1.0000 | ORAL_TABLET | Freq: Four times a day (QID) | ORAL | Status: DC | PRN

## 2014-01-30 MED ORDER — ONDANSETRON HCL 4 MG PO TABS: 4.0000 mg | ORAL_TABLET | Freq: Four times a day (QID) | ORAL | Status: DC

## 2014-01-30 MED ORDER — IBUPROFEN 600 MG PO TABS: 600.0000 mg | ORAL_TABLET | Freq: Three times a day (TID) | ORAL | Status: DC | PRN

## 2014-01-30 NOTE — Patient Instructions (Signed)

## 2014-01-30 NOTE — Progress Notes (Signed)
Chief Complaint:  Chief Complaint  Patient presents with  . Generalized Body Aches    x 1 week  . Headache  . Nausea    HPI: [name] is a 23 y.o. female who is here for  forntall HA, for the last 1 week, she takes tylenol 325 mg BID and then feels worse. Has some nausea, no sick sxs had occ hcills but no fevers, she has frontal Ha and then travels to bialteral occiput. She is unable to sleep. She has nop light or noise sensivitivy, her GM has HA but she is not sure from DM. LMP last Monday She has no HA, since April she has had small HAs for about 1 week and now she ahs a constant one. 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 She denies depression byt she feels she needs a break from being a mother and would like to go to work . She is young her husband is a bit older than her, they have not had sex in 3 months. She goes out with him mostly on weekends when he is able to get off for work  [employer] SI/HI/hallucinations   Past Medical History  Diagnosis Date  . GERD (gastroesophageal reflux disease)   . Back pain    Past Surgical History  Procedure Laterality Date  . No past surgeries     History   Social History  . Marital Status: Married    Spouse Name: N/A    Number of Children: N/A  . Years of Education: N/A   Social History Main Topics  . Smoking status: Never Smoker   . Smokeless tobacco: Never Used  . Alcohol Use: No  . Drug Use: No  . Sexual Activity: Yes    Birth Control/ Protection: None   Other Topics Concern  . None   Social History Narrative   Family History  Problem Relation Age of Onset  . Anesthesia problems Neg Hx   . Hypotension Neg Hx   . Malignant hyperthermia Neg Hx   . Pseudochol deficiency Neg Hx   . Other Neg Hx    No Known  Allergies Prior to Admission medications   Medication Sig Start Date End Date Taking? Authorizing Provider  diphenhydrAMINE (BENADRYL) 25 MG tablet Take 25-50 mg by mouth every 4 (four) hours as needed for itching.     Historical Provider, MD  HYDROcodone-acetaminophen (NORCO/VICODIN) 5-325 MG per tablet Take 1 tablet by mouth every 6 (six) hours as needed. Patient not taking: Reported on 01/30/2014 03/16/13   Arman FilterGail K Schulz, NP  naproxen (NAPROSYN) 375 MG tablet Take 1 tablet (375 mg total) by mouth 2 (two) times daily. Patient not taking: Reported on 01/30/2014 03/14/13   Darlys Galesavid Masneri, MD  ondansetron (ZOFRAN) 4 MG tablet Take 1 tablet (4 mg total) by mouth every 6 (six) hours. Patient not taking: Reported on 01/30/2014 03/14/13   Darlys Galesavid Masneri, MD  sulfamethoxazole-trimethoprim [facility](SEPTRA DS) 800-160 MG per tablet Take 1 tablet by mouth every 12 (twelve) hours. Patient not taking: Reported on 01/30/2014 03/14/13   Darlys Galesavid Masneri, MD  traMADol (ULTRAM) 50 MG tablet Take 1 tablet (50 mg total) by mouth every 6 (six) hours as needed. Patient not taking: Reported on  01/30/2014 03/13/13   Roxy Horsemanobert Browning, PA-C     ROS: The patient denies fevers, chills, night sweats, unintentional weight loss, chest pain, palpitations, wheezing, dyspnea on exertion, nausea, vomiting, abdominal pain, dysuria, hematuria, melena, numbness,  or tingling.  All other systems have been reviewed and were otherwise negative with the exception of those mentioned in the HPI and as above.    PHYSICAL EXAM: Filed Vitals:   01/30/14 0907  BP: 104/62  Pulse: 67  Temp: 97.5 F (36.4 C)  Resp: 19   Filed Vitals:   01/30/14 0907  Height: 5' 3.5" (1.613 m)  Weight: 134 lb 12.8 oz (61.145 kg)   Body mass index is 23.5 kg/(m^2).  General: Alert, no acute distress HEENT:  Normocephalic, atraumatic, oropharynx patent. EOMI, PERRLA. fundo exam nl, tm nl Cardiovascular:  Regular rate and rhythm, no rubs murmurs or gallops.  No  Carotid bruits, radial pulse intact. No pedal edema.  Respiratory: Clear to auscultation bilaterally.  No wheezes, rales, or rhonchi.  No cyanosis, no use of accessory musculature GI: No organomegaly, abdomen is soft and non-tender, positive bowel sounds.  No masses. Skin: No rashes. Neurologic: Facial musculature symmetric. CN 2-12 grossly normal Psychiatric: Patient is appropriate throughout our interaction. Lymphatic: No cervical lymphadenopathy Musculoskeletal: Gait intact.   LABS: Results for orders placed or performed in visit on 01/30/14  POCT CBC  Result Value Ref Range   WBC 4.2 (A) 4.6 - 10.2 K/uL   Lymph, poc 2.2 0.6 - 3.4   POC LYMPH PERCENT 51.6 (A) 10 - 50 %L   MID (cbc) 0.5 0 - 0.9   POC MID % 12.5 (A) 0 - 12 %M   POC Granulocyte 1.5 (A) 2 - 6.9   Granulocyte percent 35.9 (A) 37 - 80 %G   RBC 4.33 4.04 - 5.48 M/uL   Hemoglobin 13.2 12.2 - 16.2 g/dL   HCT, POC 16.140.2 09.637.7 - 47.9 %   MCV 92.7 80 - 97 fL   MCH, POC 30.6 27 - 31.2 pg   MCHC 33.0 31.8 - 35.4 g/dL   RDW, POC 04.513.2 %   Platelet Count, POC 218 142 - 424 K/uL   MPV 7.3 0 - 99.8 fL  POCT urine pregnancy  Result Value Ref Range   Preg Test, Ur Negative      EKG/XRAY:   Primary read interpreted [facility]Le [facility]   ASSESSMENT/PLAN: Encounter Diagnoses  Name Primary?  . Nausea without vomiting   . Other headache syndrome Yes  . Muscle pain   . 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  She is exceited about her online CNA degree, she will try to find a job, seh denies SI/HI/Hallucinations I gave her info about [facility] [place] and see if they have any services she may be eligible for.  Does not want depression meds she would like to work  this through [facility]  Gross sideeffects, risk and benefits, and alternatives of medications d/w patient. Patient is aware that all medications have potential sideeffects and we are unable to predict every sideeffect or drug-drug interaction that may occur.  Eleana Tocco PHUONG, DO 01/30/2014 10:36 AM

## 2014-01-31 LAB — TSH: TSH: 4.72 u[IU]/mL — ABNORMAL HIGH (ref 0.350–4.500)

## 2014-02-09 ENCOUNTER — Encounter: Payer: Self-pay | Admitting: Family Medicine

## 2014-05-15 ENCOUNTER — Ambulatory Visit (INDEPENDENT_AMBULATORY_CARE_PROVIDER_SITE_OTHER): Payer: BLUE CROSS/BLUE SHIELD | Admitting: Family Medicine

## 2014-05-15 VITALS — BP 104/62 | HR 72 | Temp 97.8°F | Resp 19 | Ht 63.5 in | Wt 134.6 lb

## 2014-05-15 DIAGNOSIS — R7989 Other specified abnormal findings of blood chemistry: Secondary | ICD-10-CM

## 2014-05-15 DIAGNOSIS — R5383 Other fatigue: Secondary | ICD-10-CM

## 2014-05-15 DIAGNOSIS — L659 Nonscarring hair loss, unspecified: Secondary | ICD-10-CM | POA: Diagnosis not present

## 2014-05-15 LAB — CBC
HCT: 39.7 % (ref 36.0–46.0)
Hemoglobin: 13 g/dL (ref 12.0–15.0)
MCH: 30.5 pg (ref 26.0–34.0)
MCHC: 32.7 g/dL (ref 30.0–36.0)
MCV: 93.2 fL (ref 78.0–100.0)
MPV: 10 fL (ref 8.6–12.4)
Platelets: 230 10*3/uL (ref 150–400)
RBC: 4.26 MIL/uL (ref 3.87–5.11)
RDW: 12.8 % (ref 11.5–15.5)
WBC: 5.6 10*3/uL (ref 4.0–10.5)

## 2014-05-15 LAB — TSH: TSH: 2.433 u[IU]/mL (ref 0.350–4.500)

## 2014-05-15 NOTE — Progress Notes (Signed)
 Chief Complaint:  Chief Complaint  Patient presents with  . Follow-up    WAS DIRECTED TO COME BACK TO OFFICE    HPI: [name] is a 24 y.o. female who is here for  recheck on her thyroid. See him in November and had slightly elevated TSH levels. She has had a lot of hair loss. She feels tired. She sleeps about 5 hours today and wakes up and has no ideal why she wakes up. She has good sleep hygiene. She does not sleep during the daytime. She has a toddler. The tonsillar sleeps the night now. She feels a little bit sad. Having had any weight gain she has had no weight gain. She has had some weight loss. 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 She does not have a good appetite. She does not feel hungry. She eats one time a day.  Wt Readings from Last 3 Encounters:  05/15/14 134 lb 9.6 oz (61.054 kg)  01/30/14 134 lb 12.8 oz (61.145 kg)  06/18/13 135 lb (61.236 kg)   Lab Results  Component Value Date   TSH 4.720* 01/30/2014     Past Medical History  Diagnosis Date  . GERD (gastroesophageal reflux disease)   . Back pain    Past Surgical History  Procedure Laterality Date  . No past surgeries     History   Social History  . Marital Status: Married    Spouse Name: N/A  . Number of Children: N/A  . Years of Education: N/A   Social History Main Topics  . Smoking status: Never Smoker   . Smokeless tobacco: Never Used  . Alcohol Use: No  . Drug Use: No  . Sexual Activity: Yes    Birth Control/ Protection: None   Other Topics Concern  . None   Social History Narrative   Family History  Problem Relation Age of Onset  . Anesthesia problems Neg Hx   . Hypotension Neg Hx   . Malignant hyperthermia Neg Hx   . Pseudochol deficiency Neg Hx   . Other Neg Hx    No Known Allergies Prior to Admission medications   Medication Sig Start Date End Date Taking? Authorizing Provider  butalbital-acetaminophen-caffeine (FIORICET) 50-325-40 MG per tablet Take 1-2  tablets by mouth every 6 (six) hours as needed for headache. Do not take tylenol with this 01/30/14 01/30/15 Yes  P , DO  diphenhydrAMINE (BENADRYL) 25 MG tablet Take 25-50 mg by mouth every 4 (four) hours as needed for itching.    Yes Historical Provider, MD  ibuprofen (ADVIL,MOTRIN) 600 MG tablet Take 1 tablet (600 mg total) by mouth every 8 (eight) hours as needed. Take with food, no other NSAids ie advil, aspirin, ibuprofen. 01/30/14  Yes  P , DO  ondansetron (ZOFRAN) 4 MG tablet Take 1 tablet (4 mg total) by mouth every 6 (six) hours. 01/30/14  Yes  P , DO     ROS: The patient denies fevers, chills, night sweats, unintentional weight loss, chest pain, palpitations, wheezing, dyspnea on exertion, nausea, vomiting, abdominal pain, dysuria, hematuria, melena, numbness, weakness, or tingling.   All other systems have been reviewed and were otherwise negative with the exception of those mentioned in the HPI and as above.    PHYSICAL EXAM: Filed Vitals:   05/15/14 0946  BP: 104/62  Pulse: 72  Temp: 97.8 F (36.6 C)  Resp: 19   Filed Vitals:   05/15/14 0946  Height: 5' 3.5" (1.613  m)  Weight: 134 lb 9.6 oz (61.054 kg)   Body mass index is 23.47 kg/(m^2).  General: Alert, no acute distress HEENT:  Normocephalic, atraumatic, oropharynx patent. EOMI, PERRLA, no appreciable thyroidmegaly Cardiovascular:  Regular rate and rhythm, no rubs murmurs or gallops.  No Carotid bruits, radial pulse intact. No pedal edema.  Respiratory: Clear to auscultation bilaterally.  No wheezes, rales, or rhonchi.  No cyanosis, no use of accessory musculature GI: No organomegaly, abdomen is soft and non-tender, positive bowel sounds.  No masses. Skin: No rashes. Neurologic: Facial musculature symmetric. Psychiatric: Patient is appropriate throughout our interaction. Lymphatic: No cervical lymphadenopathy Musculoskeletal: Gait intact.   LABS: Results for orders placed or performed in  visit on 01/30/14  COMPTE METABOLIC PANEL WITH GFR  Result Value Ref Range   Sodium 138 135 - 145 mEq/L   Potassium 4.0 3.5 - 5.3 mEq/L   Chloride 103 96 - 112 mEq/L   CO2 28 19 - 32 mEq/L   Glucose, Bld 86 70 - 99 mg/dL   BUN 9 6 - 23 mg/dL   Creat 1.610.61 0.960.50 - 0.451.10 mg/dL   Total Bilirubin 0.6 0.2 - 1.2 mg/dL   Alkaline Phosphatase 68 39 - 117 U/L   AST 23 0 - 37 U/L   ALT 22 0 - 35 U/L   Total Protein 6.9 6.0 - 8.3 g/dL   Albumin 4.4 3.5 - 5.2 g/dL   Calcium 8.9 8.4 - 40.910.5 mg/dL   GFR, Est African American >89 mL/min   GFR, Est Non African American >89 mL/min  TSH  Result Value Ref Range   TSH 4.720 (H) 0.350 - 4.500 uIU/mL  POCT CBC  Result Value Ref Range   WBC 4.2 (A) 4.6 - 10.2 K/uL   Lymph, poc 2.2 0.6 - 3.4   POC LYMPH PERCENT 51.6 (A) 10 - 50 %L   MID (cbc) 0.5 0 - 0.9   POC MID % 12.5 (A) 0 - 12 %M   POC Granulocyte 1.5 (A) 2 - 6.9   Granulocyte percent 35.9 (A) 37 - 80 %G   RBC 4.33 4.04 - 5.48 M/uL   Hemoglobin 13.2 12.2 - 16.2 g/dL   HCT, POC 81.140.2 91.437.7 - 47.9 %   MCV 92.7 80 - 97 fL   MCH, POC 30.6 27 - 31.2 pg   MCHC 33.0 31.8 - 35.4 g/dL   RDW, POC 78.213.2 %   Platelet Count, POC 218 142 - 424 K/uL   MPV 7.3 0 - 99.8 fL  POCT SEDIMENTATION RATE  Result Value Ref Range   POCT SED RATE 8 0 - 22 mm/hr  POCT urine pregnancy  Result Value Ref Range   Preg Test, Ur Negative      EKG/XRAY:   Primary read [facility]   ASSESSMENT/PLAN: Encounter Diagnoses  Name Primary?  . Abnormal TSH Yes  . Abnormal CBC   . Other fatigue   . Hair loss    Will f/u with labs wonce get TSH nad CBC If TSH is normal then consider maelatonin for improved sleep, encourage small meals throughout day.  Fu prn   Gross sideeffects, risk and benefits, and alternatives of medications d/w patient. Patient is aware that all medications have potential sideeffects and we are unable to predict every sideeffect or drug-drug interaction that may occur.  ,   PHUONG, DO 05/15/2014 10:29 AM

## 2014-08-06 ENCOUNTER — Ambulatory Visit (INDEPENDENT_AMBULATORY_CARE_PROVIDER_SITE_OTHER): Payer: BLUE CROSS/BLUE SHIELD | Admitting: Physician Assistant

## 2014-08-06 VITALS — BP 106/68 | HR 74 | Temp 98.5°F | Resp 20 | Ht 64.0 in | Wt 131.2 lb

## 2014-08-06 DIAGNOSIS — Z111 Encounter for screening for respiratory tuberculosis: Secondary | ICD-10-CM | POA: Diagnosis not present

## 2014-08-06 NOTE — Progress Notes (Signed)
[facility] [address], [place] [phone] [phone]  Date:  08/06/2014   Name:  [name]   DOB:  Sep 14, 1990   MRN:  [id number]  PCP:  No PCP Per Patient    Chief Complaint: PPD Placement   History of Present Illness:  This is a 24 y.o. female who is presenting for tb test for her job. [place] is from [place]. She has never had a ppd test before. She is not sure if she had the bcg vaccine but does have a scar on her arm from vaccine administration as a child. She is asymptomatic.  Review of Systems:  Review of Systems  Constitutional: Negative for fever and chills.  Respiratory: Negative for choking and shortness of breath.   Cardiovascular: Negative for chest pain.  Skin: Negative for rash.  Hematological: Negative for adenopathy.    Patient Active Problem List   Diagnosis Date Noted  . Active labor at term 03/13/2012  . SVD (spontaneous vaginal delivery) 03/13/2012    Prior to Admission medications   Not on File    No Known Allergies  Past Surgical History  Procedure Laterality Date  . No past surgeries      History  Substance Use Topics  . Smoking status: Never Smoker   . Smokeless tobacco: Never Used  . Alcohol Use: No    Family History  Problem Relation Age of Onset  . Anesthesia problems Neg Hx   . Hypotension Neg Hx   . Malignant hyperthermia Neg Hx   . Pseudochol deficiency Neg Hx   . Other Neg Hx     Medication list has been reviewed and updated.  Physical Examination:  Physical Exam  Constitutional: She is oriented to person, place, and time. She appears well-developed and well-nourished. No distress.  HENT:  Head: Normocephalic and atraumatic.  Right Ear: Hearing normal.  Left Ear: Hearing normal.  Nose: Nose normal.  Eyes: Conjunctivae and lids are normal. Right eye exhibits no discharge. Left eye exhibits no discharge. No scleral icterus.  Pulmonary/Chest: Effort normal. No respiratory distress.   Musculoskeletal: Normal range of motion.  Neurological: She is alert and oriented to person, place, and time.  Skin: Skin is warm, dry and intact. No lesion and no rash noted.  Psychiatric: She has a normal mood and affect. Her speech is normal and behavior is normal. Thought content normal.    BP 106/68 mmHg  Pulse 74  Temp(Src) 98.5 F (36.9 C) (Oral)  Resp 20  Ht 5\' 4"  (1.626 m)  Wt 131 lb 4 oz (59.535 kg)  BMI 22.52 kg/m2  SpO2 99%  LMP 07/19/2014  Assessment and Plan:  1. Screening-pulmonary TB Never had ppd before. Was born in [place]. She is not sure if she had the bcg vaccine but she does have a scar on her arm but vaccine administration as a child. - Quantiferon tb gold assay (blood)   Roswell MinersNicole V. Dyke BrackettBush, PA-C, MHS [facility]  08/06/2014

## 2014-08-06 NOTE — Patient Instructions (Signed)
We will call with your tb test results. If you need a copy of these results you can come pick them up from the office.

## 2014-08-06 NOTE — Progress Notes (Signed)
  Tuberculosis Risk Questionnaire  1. Yes Were you born outside the [place] in one of the following parts of the world: Africa, Asia, Central America, South America or Eastern Europe?    2. No Have you traveled outside the [place] and lived for more than one month in one of the following parts of the world: Africa, Asia, Central America, South America or Eastern Europe?    3. No Do you have a compromised immune system such as from any of the following conditions:HIV/AIDS, organ or bone marrow transplantation, diabetes, immunosuppressive medicines (e.g. Prednisone, Remicaide), leukemia, lymphoma, cancer of the head or neck, gastrectomy or jejunal bypass, end-stage renal disease (on dialysis), or silicosis?     4. Yes  Have you ever or do you plan on working in: a residential care center, a health care facility, a jail or prison or homeless shelter?    5. No Have you ever: injected illegal drugs, used crack cocaine, lived in a homeless shelter  or been in jail or prison?     6. No Have you ever been exposed to anyone with infectious tuberculosis?    Tuberculosis Symptom Questionnaire  Do you currently have any of the following symptoms?  1. No Unexplained cough lasting more than 3 weeks?   2. No Unexplained fever lasting more than 3 weeks.   3. No Night Sweats (sweating that leaves the bedclothes and sheets wet)     4. No Shortness of Breath   5. No Chest Pain   6. No Unintentional weight loss    7. No Unexplained fatigue (very tired for no reason)   

## 2014-08-07 LAB — QUANTIFERON TB GOLD ASSAY (BLOOD)
Interferon Gamma Release Assay: NEGATIVE
Mitogen value: >10 IU/mL
QUANTIFERON NIL VALUE: 0.15 [IU]/mL
Quantiferon Tb Ag Minus Nil Value: 0 IU/mL
TB Ag value: 0.12 IU/mL

## 2014-08-08 ENCOUNTER — Telehealth: Payer: Self-pay

## 2014-08-08 NOTE — Telephone Encounter (Signed)
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

## 2014-08-10 ENCOUNTER — Encounter: Payer: Self-pay | Admitting: Physician Assistant

## 2014-08-10 NOTE — Telephone Encounter (Signed)
Patient did not return within the time frame.   She has to wait two weeks and have it replaced.

## 2014-08-10 NOTE — Telephone Encounter (Signed)
Called this # provided. It is a fax #.

## 2014-09-13 ENCOUNTER — Emergency Department (HOSPITAL_COMMUNITY)
Admission: EM | Admit: 2014-09-13 | Discharge: 2014-09-13 | Disposition: A | Discharge status: Home / Self Care | Payer: BLUE CROSS/BLUE SHIELD | Attending: Emergency Medicine | Admitting: Emergency Medicine

## 2014-09-13 ENCOUNTER — Encounter (HOSPITAL_COMMUNITY): Payer: Self-pay | Admitting: *Deleted

## 2014-09-13 DIAGNOSIS — R51 Headache: Secondary | ICD-10-CM | POA: Insufficient documentation

## 2014-09-13 DIAGNOSIS — R358 Other polyuria: Secondary | ICD-10-CM | POA: Diagnosis not present

## 2014-09-13 DIAGNOSIS — R631 Polydipsia: Secondary | ICD-10-CM | POA: Diagnosis not present

## 2014-09-13 DIAGNOSIS — R519 Headache, unspecified: Secondary | ICD-10-CM

## 2014-09-13 DIAGNOSIS — R634 Abnormal weight loss: Secondary | ICD-10-CM | POA: Diagnosis not present

## 2014-09-13 DIAGNOSIS — Z8719 Personal history of other diseases of the digestive system: Secondary | ICD-10-CM | POA: Insufficient documentation

## 2014-09-13 LAB — BASIC METABOLIC PANEL
Anion gap: 7 (ref 5–15)
BUN: 14 mg/dL (ref 6–20)
CALCIUM: 9.7 mg/dL (ref 8.9–10.3)
CHLORIDE: 104 mmol/L (ref 101–111)
CO2: 26 mmol/L (ref 22–32)
Creatinine, Ser: 0.53 mg/dL (ref 0.44–1.00)
GFR calc Af Amer: >60 mL/min (ref >60)
GFR calc non Af Amer: >60 mL/min (ref >60)
Glucose, Bld: 105 mg/dL — ABNORMAL HIGH (ref 65–99)
Potassium: 3.8 mmol/L (ref 3.5–5.1)
Sodium: 137 mmol/L (ref 135–145)

## 2014-09-13 LAB — CBC WITH DIFFERENTIAL/PLATELET
BASOS ABS: 0 10*3/uL (ref 0.0–0.1)
Basophils Relative: 0 % (ref 0–1)
Eosinophils Absolute: 0.1 10*3/uL (ref 0.0–0.7)
Eosinophils Relative: 2 % (ref 0–5)
HCT: 41.3 % (ref 36.0–46.0)
Hemoglobin: 13.4 g/dL (ref 12.0–15.0)
LYMPHS ABS: 1.6 10*3/uL (ref 0.7–4.0)
LYMPHS PCT: 25 % (ref 12–46)
MCH: 30.7 pg (ref 26.0–34.0)
MCHC: 32.4 g/dL (ref 30.0–36.0)
MCV: 94.5 fL (ref 78.0–100.0)
Monocytes Absolute: 0.4 10*3/uL (ref 0.1–1.0)
Monocytes Relative: 6 % (ref 3–12)
Neutro Abs: 4.2 10*3/uL (ref 1.7–7.7)
Neutrophils Relative %: 67 % (ref 43–77)
Platelets: 244 10*3/uL (ref 150–400)
RBC: 4.37 MIL/uL (ref 3.87–5.11)
RDW: 12.8 % (ref 11.5–15.5)
WBC: 6.2 10*3/uL (ref 4.0–10.5)

## 2014-09-13 MED ORDER — HYDROCODONE-ACETAMINOPHEN 5-325 MG PO TABS: 1.0000 | ORAL_TABLET | ORAL | Status: DC | PRN

## 2014-09-13 NOTE — Discharge Instructions (Signed)
YOU WILL NEED TO CALL A PRIMARY CARE PROVIDER OF YOUR CHOICE FOR FURTHER EVALUATION OF UNEXPLAINED WEIGHT LOSS AND LOSS OF APPETITE. RETURN TO THE EMERGENCY DEPARTMENT IF HEADACHE PERSISTS OR WORSENS.   General Headache Without Cause A headache is pain or discomfort felt around the head or neck area. The specific cause of a headache may not be found. There are many causes and types of headaches. A few common ones are:  Tension headaches.  Migraine headaches.  Cluster headaches.  Chronic daily headaches. HOME CARE INSTRUCTIONS   Keep all follow-up appointments with your caregiver or any specialist referral.  Only take over-the-counter or prescription medicines for pain or discomfort as directed by your caregiver.  Lie down in a dark, quiet room when you have a headache.  Keep a headache journal to find out what may trigger your migraine headaches. For example, write down:  What you eat and drink.  How much sleep you get.  Any change to your diet or medicines.  Try massage or other relaxation techniques.  Put ice packs or heat on the head and neck. Use these 3 to 4 times per day for 15 to 20 minutes each time, or as needed.  Limit stress.  Sit up straight, and do not tense your muscles.  Quit smoking if you smoke.  Limit alcohol use.  Decrease the amount of caffeine you drink, or stop drinking caffeine.  Eat and sleep on a regular schedule.  Get 7 to 9 hours of sleep, or as recommended by your caregiver.  Keep lights dim if bright lights bother you and make your headaches worse. SEEK MEDICAL CARE IF:   You have problems with the medicines you were prescribed.  Your medicines are not working.  You have a change from the usual headache.  You have nausea or vomiting. SEEK IMMEDIATE MEDICAL CARE IF:   Your headache becomes severe.  You have a fever.  You have a stiff neck.  You have loss of vision.  You have muscular weakness or loss of muscle  control.  You start losing your balance or have trouble walking.  You feel faint or pass out.  You have severe symptoms that are different from your first symptoms. MAKE SURE YOU:   Understand these instructions.  Will watch your condition.  Will get help right away if you are not doing well or get worse. Document Released: 02/19/2005 Document Revised: 05/14/2011 Document Reviewed: 03/07/2011 [facility] Patient Information 2015 [place], [place]. This information is not intended to replace advice given to you by your health care provider. Make sure you discuss any questions you have with your health care provider.  Emergency Department Resource Guide 1) Find a Doctor and Pay Out of Pocket Although you won't have to find out who is covered by your insurance plan, it is a good idea to ask around and get recommendations. You will then need to call the office and see if the doctor you have chosen will accept you as a new patient and what types of options they offer for patients who are self-pay. Some doctors offer discounts or will set up payment plans for their patients who do not have insurance, but you will need to ask so you aren't surprised when you get to your appointment.  2) Contact Your Local Health Department Not all health departments have doctors that can see patients for sick visits, but many do, so it is worth a call to see if yours does. If you don't know  where your local health department is, you can check in your phone book. The CDC also has a tool to help you locate your state's health department, and many state websites also have listings of all of their local health departments.  3) Find a [facility] If your illness is not likely to be very severe or complicated, you may want to try a walk in clinic. These are popping up all over the country in pharmacies, drugstores, and shopping centers. They're usually staffed by nurse practitioners or physician assistants that have been  trained to treat common illnesses and complaints. They're usually fairly quick and inexpensive. However, if you have serious medical issues or chronic medical problems, these are probably not your best option.  No Primary Care Doctor: - Call Health Connect at  [phone] - they can help you locate a primary care doctor that  accepts your insurance, provides certain services, etc. - Physician Referral Service- [phone]  Chronic Pain Problems: Organization         Address  Phone   Notes  [facility]  [phone] Patients need to be referred by their primary care doctor.   Medication Assistance: Organization         Address  Phone   Notes  [facility] [address] [place], [place] [address] [phone] --Must be a resident of [facility] -- Must have NO insurance coverage whatsoever (no Medicaid/ Medicare, etc.) -- The pt. MUST have a primary care doctor that directs their care regularly and follows them in the community   [facility]  [phone]   [employer]  [phone]    Agencies that provide inexpensive medical care: Organization         Address  Phone   Notes  [place]  [phone]   [facility]    [phone]   [facility] [place], [place] [address] [phone]   [facility] [address] [place] [phone]   Planned Parenthood    [phone]   [facility]    [phone]   [place] [address], [place] Phone:  [phone], Fax:  [phone] Hours of Operation:  9 am - 6 pm, M-F.  Also accepts Medicaid/Medicare and self-pay.  [facility] for [place], [address], [place] Phone: [phone], Fax: [phone]. Hours of Operation:  8:30 am - 5:30 pm, M-F.  Also accepts Medicaid and self-pay.   [facility] [place] [address] [place] Phone: [phone]   [place], [place], [place] [phone], Ext. [phone] Mondays & Thursdays: 7-9 AM.  First 15 patients are seen on a first come, first serve basis.    [place] Providers:  Organization         Address  Phone   Notes  [facility] [address], [place] [phone] Also accepts self-pay patients.  [place], [place]  [phone]   [place], [address], [place] [phone]   [facility] [address] [place] [phone]   Lucianne Lei [address], [place]   [phone] Only accepts [employer]  patients after they have their name applied to their card.   Self-Pay (no insurance) in [facility]:  Organization         Address  Phone   Notes  Sickle Cell Patients, [facility] Internal [facility] [address] [place], [place] [phone]   [facility] Urgent Care [address] [place], [place] [phone]   [facility] [place]  [address], [place] [phone]   [facility]/Dr. Osei-Bonsu  [address], [place] or [address], [place] [phone] Phone number for both [place] and [place] locations is the same.  [facility]gent [facility] and [facility] [address] [place] [phone]   [facility] [address], [place] or [address] [phone]   [facility] [address] [place] [phone], phone; [phone], fax Sees patients 1st and 3rd Saturday of every month.  Must not qualify for public or private insurance (i.e. Medicaid, Medicare, [place] Health Choice, Veterans' Benefits)  Household income should be no  more than 200% of the poverty level The clinic cannot treat you if you are pregnant or think you are pregnant  Sexually transmitted diseases are not treated at the clinic.    Dental Care: Organization         Address  Phone  Notes  [facility] Department of [facility] [address] [place], [place] [phone] Accepts children up to age 38 who are enrolled in [place] or [place] Health Choice; pregnant women with a Medicaid card; and children who have applied for Medicaid or [place] Health Choice, but were declined, whose parents can pay a reduced fee at time of service.  [facility] Department of [facility]  [address], [place] [phone] Accepts children up to age 45 who are enrolled in [place] or [place] Health Choice; pregnant women with a Medicaid card; and children who have applied for Medicaid or [place] Health Choice, but were declined, whose parents can pay a reduced fee at time of service.  [facility]  [address] [place], [place] [phone] Patients are seen by appointment only. Walk-ins are not accepted. [facility] will see patients 56 years of age and older. Monday - Tuesday (8am-5pm) Most Wednesdays (8:30-5pm) $30 per visit, cash only  [facility]  [address], [facility] [phone] Patients are seen by appointment only. Walk-ins are not accepted. [facility] will see patients 49 years of age and older. One Wednesday Evening (Monthly: Volunteer Based).  $30 per visit, cash only  [employer]  [phone] for adults; Children under age 52, call Graduate Pediatric Dentistry at [phone]. Children aged 78-14, please call [phone] to request a pediatric application.  Dental services are provided in all areas of dental care including fillings, crowns and bridges, complete and partial dentures, implants, gum treatment, root canals,  and extractions. Preventive care is also provided. Treatment is provided to both adults and children. Patients are selected via a lottery and there is often a waiting list.   [facility] [address], [place]  [phone] [email or web address]   [facility] [address] [place], [place] [phone], Ext. [phone] Second and Fourth Thursday of each month, opens at 6:30 AM; Clinic ends at 9 AM.  Patients are seen on a first-come first-served basis, and a limited number are seen during each clinic.   [facility]  [address], [place], [place] [phone]   Eligibility Requirements You must have lived in [place], [place], or [place] counties for at least the last three months.   You cannot be eligible for state or federal sponsored [place]healthcare insurance, including [employer]IG[employer]eterans Administration, [place]icaid, or [employer]icare.   You generally cannot be eligible for healthcare insurance through your employer.    How to apply: Eligibility screenings are held every Tuesday and Wednesday afternoon from 1:00 pm until 4:00 pm. You do not need an appointment for the interview!  [facility] [address], [place], [place] [phone]   [facility]  [phone]   [facility]  [phone]   [facility]  [phone]    Behavioral Health Resources in the Community: Intensive Outpatient Programs Organization         Address  Phone  Notes  [facility] [address], [place], [place] [phone]   [facility] [address], [place], [place] [phone]   [facility] [address], [place], [place]  [phone]   [facility] [address],  [place], [place] [phone] or [phone]   Substance Abuse Resources Organization         Address  Phone  Notes  [facility]  [phone]    [facility]  [phone]   [facility]  [phone]   [facility]  [phone]   Residential & Outpatient Substance Abuse Program  [phone]   Psychological Services Organization         Address  Phone  Notes  [facility]  [phone]- [phone]   [facility]  [phone]- [phone]   [facility] [address], [place] [phone] or [phone]    Mobile Crisis Teams Organization         Address  Phone  Notes  [facility], Mobile Crisis Care Unit  [phone]   [facility]  [address] [place], [place] [phone]   Doristine LocksSharon DeEsch [address] [place] [phone]    Self-Help/Support Groups Organization         Address  Phone             Notes  [facility] [place] - variety of support groups  336- [phone] Call for more information  Narcotics Anonymous (NA), [facility] [address], [place] Jud  2 meetings at this location   Statisticianesidential Treatment Programs Organization         Address  Phone  Notes  [facility] [address],    [place]  [phone]   [facility]  [address], [place] [phone], [place], [place] [phone]   [facility] [address], [place] [phone] Admissions: 8am-3pm M-F  [facility] [address]    [place], [place] [phone]   [facility] [address], [place], [place] [phone]   [facility] [address]  [place], [place] [phone]   [facility] - Intensive Outpatient [address], [place], [place] [phone]   [facility] ([facility] [address]  [place], [place] [phone] or [phone]   [facility] ([facility]) [address]  Sherian Maroon [place], [place] [phone] Accepts Medicaid  [facility] [place] [address]   [place] [phone] Substance Abuse/Addiction Treatment   [facility] Organization         Address  Phone  Notes  [facility]  [phone]   Angie Fava, PhD [address] Ervin Knack [place], [place]   [phone] or [phone]   [facility]   [address] [place], [place] [phone]   [facility] [address] [place], [place] [phone] Insurance/Medicaid/sponsorship through [facility] and Families [address]                                    [place], [place] [phone] Therapy/tele-psych/case  [facility] [address], [place] [phone]    Dr. Lolly Mustache  [phone]   [facility] [place]  [employer] [facility] Dept. 1) [address]  2) [address], [place] 3)  [address], [place] [phone]  [phone]   [facility] Child Abuse Hotline [phone] or [phone] (After Hours)

## 2014-09-13 NOTE — ED Notes (Signed)
Pt states that she began with a headache around 11pm that "won't go away"; pt states that she has not taken any medicine or tried anything for her head; pt denies visual changes

## 2014-09-14 NOTE — ED Provider Notes (Signed)
CSN: [id number]     Arrival date & time 09/13/14  0109 History   First MD Initiated Contact with Patient 09/13/14 0142     Chief Complaint  Patient presents with  . Headache     (Consider location/radiation/quality/duration/timing/severity/associated sxs/prior Treatment) Patient is a 24 y.o. female presenting with headaches. The history is provided by the patient. No language interpreter was used.  Headache Pain location:  Frontal Radiates to:  Does not radiate Associated symptoms: no fever   Associated symptoms comment:  She presents with a headache starting earlier tonight in bilateral frontal region. She does not have a history of headaches. She has not taken anything at home to relieve her headache pain. No nausea, vomiting, fever, or sinus or nasal congestion. She is also concerned because she has lost her appetite over the last month causing an approximate 30 pound weight loss. She reports she has a family history of diabetes and she became concerned she has developed this condition and that this is contributing to her headache pain.    Past Medical History  Diagnosis Date  . GERD (gastroesophageal reflux disease)   . Back pain    Past Surgical History  Procedure Laterality Date  . No past surgeries     Family History  Problem Relation Age of Onset  . Anesthesia problems Neg Hx   . Hypotension Neg Hx   . Malignant hyperthermia Neg Hx   . Pseudochol deficiency Neg Hx   . Other Neg Hx    History  Substance Use Topics  . Smoking status: Never Smoker   . Smokeless tobacco: Never Used  . Alcohol Use: No   OB History    Gravida Para Term Preterm AB TAB SAB Ectopic Multiple Living   0 0 0 0 0 0 1     Review of Systems  Constitutional: Negative for fever and chills.  Respiratory: Negative.   Cardiovascular: Negative.   Gastrointestinal: Negative.   Endocrine: Positive for polydipsia and polyuria.  Genitourinary: Negative for dysuria.  Musculoskeletal:  Negative.   Skin: Negative.   Neurological: Positive for headaches.      Allergies  Review of patient's allergies indicates no known allergies.  Home Medications   Prior to Admission medications   Medication Sig Start Date End Date Taking? Authorizing Provider  HYDROcodone-acetaminophen (NORCO/VICODIN) 5-325 MG per tablet Take 1 tablet by mouth every 4 (four) hours as needed. 09/13/14   Elpidio Anis, PA-C   BP 112/79 mmHg  Pulse 63  Temp(Src) 98.7 F (37.1 C) (Oral)  Resp 18  SpO2 100%  LMP 09/10/2014 Physical Exam  Constitutional: She is oriented to person, place, and time. She appears well-developed and well-nourished.  HENT:  Head: Normocephalic.  Nose: Right sinus exhibits frontal sinus tenderness. Left sinus exhibits frontal sinus tenderness.  Eyes: Pupils are equal, round, and reactive to light.  Neck: Normal range of motion. Neck supple.  Cardiovascular: Normal rate and regular rhythm.   Pulmonary/Chest: Effort normal and breath sounds normal.  Abdominal: Soft. Bowel sounds are normal. There is no tenderness. There is no rebound and no guarding.  Musculoskeletal: Normal range of motion.  Neurological: She is alert and oriented to person, place, and time. She has normal strength and normal reflexes. No sensory deficit. She displays a negative Romberg sign. Coordination normal.  Skin: Skin is warm and dry. No rash noted.  Psychiatric: She has a normal mood and affect.    ED Course  Procedures (including critical care time)  Labs Review Labs Reviewed  BASIC METABOLIC PANEL - Abnormal; Notable for the following:    Glucose, Bld 105 (*)    All other components within normal limits  CBC WITH DIFFERENTIAL/PLATELET   Results for orders placed or performed during the hospital encounter of 09/13/14  Basic metabolic panel  Result Value Ref Range   Sodium 137 135 - 145 mmol/L   Potassium 3.8 3.5 - 5.1 mmol/L   Chloride 104 101 - 111 mmol/L   CO2 26 22 - 32 mmol/L    Glucose, Bld 105 (H) 65 - 99 mg/dL   BUN 14 6 - 20 mg/dL   Creatinine, Ser 1.610.53 0.44 - 1.00 mg/dL   Calcium 9.7 8.9 - 09.610.3 mg/dL   GFR calc non Af Amer >60 >60 mL/min   GFR calc Af Amer >60 >60 mL/min   Anion gap 7 5 - 15  CBC with Differential  Result Value Ref Range   WBC 6.2 4.0 - 10.5 K/uL   RBC 4.37 3.87 - 5.11 MIL/uL   Hemoglobin 13.4 12.0 - 15.0 g/dL   HCT 04.541.3 40.936.0 - 81.146.0 %   MCV 94.5 78.0 - 100.0 fL   MCH 30.7 26.0 - 34.0 pg   MCHC 32.4 30.0 - 36.0 g/dL   RDW 91.412.8 78.211.5 - 95.615.5 %   Platelets 244 150 - 400 K/uL   Neutrophils Relative % 67 43 - 77 %   Neutro Abs 4.2 1.7 - 7.7 K/uL   Lymphocytes Relative 25 12 - 46 %   Lymphs Abs 1.6 0.7 - 4.0 K/uL   Monocytes Relative 6 3 - 12 %   Monocytes Absolute 0.4 0.1 - 1.0 K/uL   Eosinophils Relative 2 0 - 5 %   Eosinophils Absolute 0.1 0.0 - 0.7 K/uL   Basophils Relative 0 0 - 1 %   Basophils Absolute 0.0 0.0 - 0.1 K/uL    Imaging Review No results found.   EKG Interpretation None      MDM   Final diagnoses:  Nonintractable headache, unspecified chronicity pattern, unspecified headache type  Weight loss, abnormal    The patient is well appearing, NAD. Nonfocal neurologic exam. She has a headache that is reproducible with sinus palpation but no evidence of sinus congestion and no fever. Feel she would benefit from community PCP for further evaluation and management of headache and also for unexplained weight loss. Patient agreeable to plan.     Elpidio AnisShari Nesbit Michon, PA-C 09/14/14 [id number]  Derwood KaplanAnkit Nanavati, MD 09/15/14 [phone]

## 2016-01-05 ENCOUNTER — Ambulatory Visit (INDEPENDENT_AMBULATORY_CARE_PROVIDER_SITE_OTHER): Payer: BLUE CROSS/BLUE SHIELD | Admitting: Physician Assistant

## 2016-01-05 VITALS — BP 116/64 | HR 75 | Temp 98.4°F | Resp 16 | Ht 64.0 in | Wt 129.4 lb

## 2016-01-05 DIAGNOSIS — L298 Other pruritus: Secondary | ICD-10-CM

## 2016-01-05 DIAGNOSIS — Z23 Encounter for immunization: Secondary | ICD-10-CM | POA: Diagnosis not present

## 2016-01-05 DIAGNOSIS — N898 Other specified noninflammatory disorders of vagina: Secondary | ICD-10-CM

## 2016-01-05 DIAGNOSIS — R350 Frequency of micturition: Secondary | ICD-10-CM | POA: Diagnosis not present

## 2016-01-05 LAB — POCT URINALYSIS DIP (MANUAL ENTRY)
BILIRUBIN UA: NEGATIVE
Bilirubin, UA: NEGATIVE
Blood, UA: NEGATIVE
GLUCOSE UA: NEGATIVE
Leukocytes, UA: NEGATIVE
Nitrite, UA: NEGATIVE
Protein Ur, POC: NEGATIVE
Urobilinogen, UA: 0.2
pH, UA: 8

## 2016-01-05 LAB — POC MICROSCOPIC URINALYSIS (UMFC): MUCUS RE: ABSENT

## 2016-01-05 LAB — POCT WET + KOH PREP
TRICH BY WET PREP: ABSENT
Yeast by KOH: ABSENT
Yeast by wet prep: ABSENT

## 2016-01-05 MED ORDER — FLUCONAZOLE 150 MG PO TABS: 150.0000 mg | ORAL_TABLET | Freq: Once | ORAL | 0 refills | Status: AC

## 2016-01-05 NOTE — Patient Instructions (Addendum)
Take diflucan. If your symptoms persist in 2-3 days follow up.  Monilial Vaginitis Vaginitis in a soreness, swelling and redness (inflammation) of the vagina and vulva. Monilial vaginitis is not a sexually transmitted infection. CAUSES  Yeast vaginitis is caused by yeast (candida) that is normally found in your vagina. With a yeast infection, the candida has overgrown in number to a point that upsets the chemical balance. SYMPTOMS   White, thick vaginal discharge.  Swelling, itching, redness and irritation of the vagina and possibly the lips of the vagina (vulva).  Burning or painful urination.  Painful intercourse. DIAGNOSIS  Things that may contribute to monilial vaginitis are:  Postmenopausal and virginal states.  Pregnancy.  Infections.  Being tired, sick or stressed, especially if you had monilial vaginitis in the past.  Diabetes. Good control will help lower the chance.  Birth control pills.  Tight fitting garments.  Using bubble bath, feminine sprays, douches or deodorant tampons.  Taking certain medications that kill germs (antibiotics).  Sporadic recurrence can occur if you become ill. TREATMENT  Your caregiver will give you medication.  There are several kinds of anti monilial vaginal creams and suppositories specific for monilial vaginitis. For recurrent yeast infections, use a suppository or cream in the vagina 2 times a week, or as directed.  Anti-monilial or steroid cream for the itching or irritation of the vulva may also be used. Get your caregiver's permission.  Painting the vagina with methylene blue solution may help if the monilial cream does not work.  Eating yogurt may help prevent monilial vaginitis. HOME CARE INSTRUCTIONS   Finish all medication as prescribed.  Do not have sex until treatment is completed or after your caregiver tells you it is okay.  Take warm sitz baths.  Do not douche.  Do not use tampons, especially scented  ones.  Wear cotton underwear.  Avoid tight pants and panty hose.  Tell your sexual partner that you have a yeast infection. They should go to their caregiver if they have symptoms such as mild rash or itching.  Your sexual partner should be treated as well if your infection is difficult to eliminate.  Practice safer sex. Use condoms.  Some vaginal medications cause latex condoms to fail. Vaginal medications that harm condoms are:  Cleocin cream.  Butoconazole (Femstat).  Terconazole (Terazol) vaginal suppository.  Miconazole (Monistat) (may be purchased over the counter). SEEK MEDICAL CARE IF:   You have a temperature by mouth above 102 F (38.9 C).  The infection is getting worse after 2 days of treatment.  The infection is not getting better after 3 days of treatment.  You develop blisters in or around your vagina.  You develop vaginal bleeding, and it is not your menstrual period.  You have pain when you urinate.  You develop intestinal problems.  You have pain with sexual intercourse.   This information is not intended to replace advice given to you by your health care provider. Make sure you discuss any questions you have with your health care provider.   Document Released: 11/29/2004 Document Revised: 05/14/2011 Document Reviewed: 08/23/2014 Elsevier Interactive Patient Education 2016 [employer]    IF you received an x-ray today, you will receive an invoice from [facility]. Please contact [facility] at [phone] with questions or concerns regarding your invoice.   IF you received labwork today, you will receive an invoice from [facility]. Please contact [facility] at [phone] with questions or concerns regarding your invoice.   Our  billing staff will not be able to assist you with questions regarding bills from these companies.  You will be contacted with the lab results as soon as they are  available. The fastest way to get your results is to activate your My Chart account. Instructions are located on the last page of this paperwork. If you have not heard from us regarding the results in 2 weeks, please contact this office.

## 2016-01-05 NOTE — Progress Notes (Signed)
01/05/2016 at 11:41 AM  [name] / DOB: Oct 31, 1990 / MRN: [id number]  The patient  does not have any active problems on file.  SUBJECTIVE  [name] is a 25 y.o. female who complains of urinary frequency, vaginal discharge and vaginal itching x 3 days. She denies dysuria, hematuria, urinary urgency, flank pain, abdominal pain, pelvic pain, cloudy malordorous urine and genital rash. Has tried monostat cream last night with no relief. Most recent yeast infection prior to this was 2012. She has never had a UTI. Pt is sexually with monogamous partner.    She  has a past medical history of Back pain and GERD (gastroesophageal reflux disease).    Medications reviewed and updated by myself where necessary, and exist elsewhere in the encounter.   [name] has No Known Allergies. She  reports that she has never smoked. She has never used smokeless tobacco. She reports that she does not drink alcohol or use drugs. She  reports that she currently engages in sexual activity. She reports using the following method of birth control/protection: None. The patient  has a past surgical history that includes No past surgeries.  Her family history includes Hypertension in her mother.  Review of Systems  Constitutional: Negative for diaphoresis and fever.  Gastrointestinal: Negative for abdominal pain, diarrhea and vomiting.    OBJECTIVE  Her  height is 5\' 4"  (1.626 m) and weight is 129 lb 6.4 oz (58.7 kg). Her oral temperature is 98.4 F (36.9 C). Her blood pressure is 116/64 and her pulse is 75. Her respiration is 16 and oxygen saturation is 99%.  The patient's body mass index is 22.21 kg/m.  Physical Exam  Constitutional: She is oriented to person, place, and time. She appears well-developed and well-nourished.  HENT:  Head: Normocephalic and atraumatic.  Neck: Normal range of motion.  Respiratory: Effort normal.  GI: Soft. Normal appearance. There is no tenderness. There is no CVA tenderness.    Genitourinary: There is no rash or tenderness on the right labia. There is no rash or tenderness on the left labia. There is erythema (in canal ) in the vagina. Vaginal discharge ( creamy white) found.  Neurological: She is alert and oriented to person, place, and time.  Skin: Skin is warm and dry.    Results for orders placed or performed in visit on 01/05/16 (from the past 24 hour(s))  POCT Microscopic Urinalysis ([facility])     Status: None   Collection Time: 01/05/16 11:28 AM  Result Value Ref Range   WBC,UR,HPF,POC None None WBC/hpf   RBC,UR,HPF,POC None None RBC/hpf   Bacteria None None, Too numerous to count   Mucus Absent Absent   Epithelial Cells, UR Per Microscopy None None, Too numerous to count cells/hpf  POCT urinalysis dipstick     Status: None   Collection Time: 01/05/16 11:28 AM  Result Value Ref Range   Color, UA yellow yellow   Clarity, UA clear clear   Glucose, UA negative negative   Bilirubin, UA negative negative   Ketones, POC UA negative negative   Spec Grav, UA >=1.030    Blood, UA negative negative   pH, UA 8.0    Protein Ur, POC negative negative   Urobilinogen, UA 0.2    Nitrite, UA Negative Negative   Leukocytes, UA Negative Negative  POCT Wet + KOH Prep     Status: None   Collection Time: 01/05/16 11:28 AM  Result Value Ref Range   Yeast by KOH Absent  Present, Absent   Yeast by wet prep Absent Present, Absent   WBC by wet prep None None, Few, Too numerous to count   Clue Cells Wet Prep HPF POC None None, Too numerous to count   Trich by wet prep Absent Present, Absent   Bacteria Wet Prep HPF POC Few None, Few, Too numerous to count   Epithelial Cells By [employer]et Pref ([facility]) Few None, Few, Too numerous to count   RBC,UR,HPF,POC None None RBC/hpf    ASSESSMENT & PLAN  Harmony was seen today for vaginal itching, vaginal discharge and immunizations.  Diagnoses and all orders for this visit:  Vaginal itching -     POCT Wet + KOH Prep -     fluconazole  (DIFLUCAN) 150 MG tablet; Take 1 tablet (150 mg total) by mouth once.  Urinary frequency -     POCT Microscopic Urinalysis ([facility]) -     POCT urinalysis dipstick -     Urine culture  Need for prophylactic vaccination and inoculation against influenza -     Flu Vaccine QUAD 36+ mos IM   -Although there was no yeast found on KOH prep, will treat for yeas infection due to patient's symptoms and the fact that it may have been masked by the recent use of monostat cream.   -The patient was advised to call or come back to clinic if she does not see an improvement in symptoms in 2-3 days, or worsens with the above plan.   Cindy CoreBrittany Xariah Silvernail, PA-C [facility] 01/05/2016 11:41 AM

## 2016-01-06 LAB — URINE CULTURE

## 2016-01-10 ENCOUNTER — Telehealth: Payer: Self-pay

## 2016-01-10 NOTE — Telephone Encounter (Signed)
Patient seen 01/05/16 for vaginal itching and given 1 dose diflucan. Itching has not subsided, no new discharge, complaints or concerns. Would like another dose of diflucan or other suggestion at this point for vaginal itching.

## 2016-01-11 ENCOUNTER — Other Ambulatory Visit: Payer: Self-pay | Admitting: Physician Assistant

## 2016-01-11 MED ORDER — CLOTRIMAZOLE-BETAMETHASONE 1-0.05 % EX CREA: 1.0000 "application " | TOPICAL_CREAM | Freq: Two times a day (BID) | CUTANEOUS | 0 refills | Status: AC

## 2016-01-11 NOTE — Progress Notes (Signed)
Meds ordered this encounter  Medications  . clotrimazole-betamethasone (LOTRISONE) cream    Sig: Apply 1 application topically 2 (two) times daily.    Dispense:  30 g    Refill:  0    Order Specific Question:   Supervising Provider    Answer:   SMITH, KRISTI M [[id number]]    

## 2016-01-11 NOTE — Telephone Encounter (Signed)
Please call pt and let her know that I put in a prescription for a topical cream. She can use it as external application to the vulva for itching. She can use it twice a day for two weeks. Please let her know to come back if she has no improvement with the cream. Thanks!

## 2016-01-13 ENCOUNTER — Encounter (HOSPITAL_COMMUNITY): Payer: Self-pay

## 2016-01-13 ENCOUNTER — Emergency Department (HOSPITAL_COMMUNITY)
Admission: EM | Admit: 2016-01-13 | Discharge: 2016-01-14 | Disposition: A | Discharge status: Home / Self Care | Payer: BLUE CROSS/BLUE SHIELD | Attending: Emergency Medicine | Admitting: Emergency Medicine

## 2016-01-13 DIAGNOSIS — O0281 Inappropriate change in quantitative human chorionic gonadotropin (hCG) in early pregnancy: Secondary | ICD-10-CM | POA: Insufficient documentation

## 2016-01-13 DIAGNOSIS — N72 Inflammatory disease of cervix uteri: Secondary | ICD-10-CM

## 2016-01-13 DIAGNOSIS — O23511 Infections of cervix in pregnancy, first trimester: Secondary | ICD-10-CM | POA: Diagnosis not present

## 2016-01-13 DIAGNOSIS — Z3A01 Less than 8 weeks gestation of pregnancy: Secondary | ICD-10-CM | POA: Diagnosis not present

## 2016-01-13 DIAGNOSIS — R52 Pain, unspecified: Secondary | ICD-10-CM

## 2016-01-13 DIAGNOSIS — O26891 Other specified pregnancy related conditions, first trimester: Secondary | ICD-10-CM | POA: Diagnosis present

## 2016-01-13 DIAGNOSIS — Z3490 Encounter for supervision of normal pregnancy, unspecified, unspecified trimester: Secondary | ICD-10-CM

## 2016-01-13 LAB — CBC
HCT: 38.1 % (ref 36.0–46.0)
HEMOGLOBIN: 13 g/dL (ref 12.0–15.0)
MCH: 31.6 pg (ref 26.0–34.0)
MCHC: 34.1 g/dL (ref 30.0–36.0)
MCV: 92.5 fL (ref 78.0–100.0)
Platelets: 234 10*3/uL (ref 150–400)
RBC: 4.12 MIL/uL (ref 3.87–5.11)
RDW: 12.7 % (ref 11.5–15.5)
WBC: 7.5 10*3/uL (ref 4.0–10.5)

## 2016-01-13 NOTE — ED Triage Notes (Signed)
Pt c/o abd cramping, nausea, and back pain for the past two days. Pt denies any urinary s/s.

## 2016-01-14 ENCOUNTER — Emergency Department (HOSPITAL_COMMUNITY): Payer: BLUE CROSS/BLUE SHIELD

## 2016-01-14 LAB — COMPREHENSIVE METABOLIC PANEL
ALK PHOS: 48 U/L (ref 38–126)
ALT: 15 U/L (ref 14–54)
AST: 20 U/L (ref 15–41)
Albumin: 4.2 g/dL (ref 3.5–5.0)
Anion gap: 8 (ref 5–15)
BUN: 10 mg/dL (ref 6–20)
CHLORIDE: 103 mmol/L (ref 101–111)
CO2: 24 mmol/L (ref 22–32)
CREATININE: 0.57 mg/dL (ref 0.44–1.00)
Calcium: 9 mg/dL (ref 8.9–10.3)
GFR calc Af Amer: >60 mL/min (ref >60)
GFR calc non Af Amer: >60 mL/min (ref >60)
Glucose, Bld: 89 mg/dL (ref 65–99)
Potassium: 3.8 mmol/L (ref 3.5–5.1)
Sodium: 135 mmol/L (ref 135–145)
Total Bilirubin: 1.5 mg/dL — ABNORMAL HIGH (ref 0.3–1.2)
Total Protein: 6.9 g/dL (ref 6.5–8.1)

## 2016-01-14 LAB — URINALYSIS, ROUTINE W REFLEX MICROSCOPIC
BILIRUBIN URINE: NEGATIVE
Glucose, UA: NEGATIVE mg/dL
Hgb urine dipstick: NEGATIVE
Ketones, ur: 15 mg/dL — AB
Nitrite: NEGATIVE
PH: 6.5 (ref 5.0–8.0)
Protein, ur: NEGATIVE mg/dL
SPECIFIC GRAVITY, URINE: 1.029 (ref 1.005–1.030)

## 2016-01-14 LAB — I-STAT BETA HCG BLOOD, ED (MC, WL, AP ONLY): I-stat hCG, quantitative: >2000 m[IU]/mL — ABNORMAL HIGH (ref <5)

## 2016-01-14 LAB — LIPASE, BLOOD: LIPASE: 27 U/L (ref 11–51)

## 2016-01-14 LAB — WET PREP, GENITAL
Sperm: NONE SEEN
TRICH WET PREP: NONE SEEN
YEAST WET PREP: NONE SEEN

## 2016-01-14 LAB — URINE MICROSCOPIC-ADD ON: RBC / HPF: NONE SEEN RBC/hpf (ref 0–5)

## 2016-01-14 LAB — HCG, QUANTITATIVE, PREGNANCY: hCG, Beta Chain, Quant, S: 55826 m[IU]/mL — ABNORMAL HIGH (ref <5)

## 2016-01-14 MED ORDER — AZITHROMYCIN 250 MG PO TABS
1000.0000 mg | ORAL_TABLET | Freq: Once | ORAL | Status: AC
  Administered 2016-01-14: 1000 mg via ORAL
  Filled 2016-01-14: qty 4

## 2016-01-14 MED ORDER — LIDOCAINE HCL (PF) 1 % IJ SOLN
INTRAMUSCULAR | Status: AC
  Administered 2016-01-14: 05:00:00
  Filled 2016-01-14: qty 5

## 2016-01-14 MED ORDER — CEFTRIAXONE SODIUM 250 MG IJ SOLR
250.0000 mg | Freq: Once | INTRAMUSCULAR | Status: AC
  Administered 2016-01-14: 250 mg via INTRAMUSCULAR
  Filled 2016-01-14: qty 250

## 2016-01-14 MED ORDER — ONDANSETRON 4 MG PO TBDP
4.0000 mg | ORAL_TABLET | Freq: Once | ORAL | Status: AC
  Administered 2016-01-14: 4 mg via ORAL
  Filled 2016-01-14: qty 1

## 2016-01-14 NOTE — ED Provider Notes (Signed)
[facility]-EMERGENCY DEPT Provider Note   CSN: [id number] Arrival date & time: 01/13/16  2319     History   Chief Complaint Chief Complaint  Patient presents with  . Back Pain  . Abdominal Cramping    HPI [name] is a 25 y.o. female.  HPI Patient presents with lower abdominal and low back cramping for the past 2 days. States the pain is intermittent. Has had urinary frequency without dysuria, hematuria or urgency. Says she's had a vaginal discharge now for several months which is unchanged. Denies any vaginal bleeding. Last menstrual periods was 12/02/15. States she is regular. She sexually active but does not use contraception. G1 P1. No fever or chills. Complains of nausea and multiple episodes of vomiting the past 2 days. Denies diarrhea or constipation. Past Medical History:  Diagnosis Date  . Back pain   . GERD (gastroesophageal reflux disease)     There are no active problems to display for this patient.   Past Surgical History:  Procedure Laterality Date  . NO PAST SURGERIES      OB History    Gravida Para Term Preterm AB Living   1 1 1  0 0 1   SAB TAB Ectopic Multiple Live Births   0 0 0 0 1       Home Medications    Prior to Admission medications   Medication Sig Start Date End Date Taking? Authorizing Provider  clotrimazole-betamethasone (LOTRISONE) cream Apply 1 application topically 2 (two) times daily. 01/11/16 01/25/16 Yes Magdalene River, PA-C    Family History Family History  Problem Relation Age of Onset  . Hypertension Mother   . Anesthesia problems Neg Hx   . Hypotension Neg Hx   . Malignant hyperthermia Neg Hx   . Pseudochol deficiency Neg Hx   . Other Neg Hx     Social History Social History  Substance Use Topics  . Smoking status: Never Smoker  . Smokeless tobacco: Never Used  . Alcohol use No     Allergies   Patient has no known allergies.   Review of Systems Review of Systems  Constitutional: Negative for chills,  fatigue and fever.  Respiratory: Negative for chest tightness and shortness of breath.   Cardiovascular: Negative for chest pain.  Gastrointestinal: Positive for abdominal pain, nausea and vomiting. Negative for constipation and diarrhea.  Genitourinary: Positive for frequency and vaginal discharge. Negative for difficulty urinating, dysuria, flank pain and vaginal bleeding.  Musculoskeletal: Positive for back pain and myalgias. Negative for neck pain and neck stiffness.  Skin: Negative for rash and wound.  Neurological: Negative for dizziness, weakness, light-headedness, numbness and headaches.  All other systems reviewed and are negative.    Physical Exam Updated Vital Signs BP 104/63 (BP Location: Right Arm)   Pulse 80   Temp 98.4 F (36.9 C) (Oral)   Resp 16   Wt 120 lb (54.4 kg)   LMP 12/02/2015   SpO2 100%   BMI 20.60 kg/m   Physical Exam  Constitutional: She is oriented to person, place, and time. She appears well-developed and well-nourished. No distress.  HENT:  Head: Normocephalic and atraumatic.  Mouth/Throat: Oropharynx is clear and moist. No oropharyngeal exudate.  Eyes: EOM are normal. Pupils are equal, round, and reactive to light.  Neck: Normal range of motion. Neck supple.  Cardiovascular: Normal rate and regular rhythm.   Pulmonary/Chest: Effort normal and breath sounds normal. No respiratory distress. She has no wheezes. She has no rales. She exhibits no  tenderness.  Abdominal: Soft. Bowel sounds are normal. She exhibits no distension. There is tenderness (ild suprapubic tenderness to palpation.). There is no rebound and no guarding.  Genitourinary: Vaginal discharge found.  Genitourinary Comments: Thick yellow discharge from the cervix. No blood or tissue appreciated. No cervical motion tenderness. Patient does have tenderness to palpation over the fundus of the uterus and the right adnexa.  Musculoskeletal: Normal range of motion. She exhibits no edema or  tenderness.  No CVA tenderness. Anterior lumbar paraspinal muscular tenderness to palpation. No midline thoracic or lumbar tenderness.  Neurological: She is alert and oriented to person, place, and time.  Skin: Skin is warm and dry. Capillary refill takes less than 2 seconds. No rash noted. No erythema.  Psychiatric: She has a normal mood and affect. Her behavior is normal.  Nursing note and vitals reviewed.    ED Treatments / Results  Labs (all labs ordered are listed, but only abnormal results are displayed) Labs Reviewed  WET PREP, GENITAL - Abnormal; Notable for the following:       Result Value   Clue Cells Wet Prep HPF POC PRESENT (*)    WBC, Wet Prep HPF POC MODERATE (*)    All other components within normal limits  COMPREHENSIVE METABOLIC PANEL - Abnormal; Notable for the following:    Total Bilirubin 1.5 (*)    All other components within normal limits  URINALYSIS, ROUTINE W REFLEX MICROSCOPIC (NOT AT [facility]) - Abnormal; Notable for the following:    APPearance CLOUDY (*)    Ketones, ur 15 (*)    Leukocytes, UA SMALL (*)    All other components within normal limits  URINE MICROSCOPIC-ADD ON - Abnormal; Notable for the following:    Squamous Epithelial / LPF 0-5 (*)    Bacteria, UA FEW (*)    All other components within normal limits  HCG, QUANTITATIVE, PREGNANCY - Abnormal; Notable for the following:    hCG, Beta Chain, Quant, S 21,30855,826 (*)    All other components within normal limits  I-STAT BETA HCG BLOOD, ED ([facility], [facility], [facility] ONLY) - Abnormal; Notable for the following:    I-stat hCG, quantitative >2,000.0 (*)    All other components within normal limits  LIPASE, BLOOD  CBC  GC/CHLAMYDIA PROBE AMP ([place] [place]) NOT AT [facility]    EKG  EKG Interpretation None       Radiology [place] Ob Comp Less 14 Wks  Result Date: 01/14/2016 CLINICAL DATA:  Abdominal cramping, back pain, and nausea for 2 days. Estimated gestational age by LMP is 6 weeks 1 day. Quantitative beta HCG is  55,826 EXAM: OBSTETRIC <14 WK US AND TRANSVAGINAL OB US TECHNIQUE: Both transabdominal and transvaginal ultrasound examinations were performed for complete evaluation of the gestation as well as the maternal uterus, adnexal regions, and pelvic cul-de-sac. Transvaginal technique was performed to assess early pregnancy. COMPARISON:  None. FINDINGS: Intrauterine gestational sac: A single intrauterine gestational sac is present. Yolk sac:  Yolk sac is present. Embryo:  Fetal pole is present. Cardiac Activity: Fetal cardiac activity is observed. Heart Rate: 128  bpm CRL:  7.9  mm   6 w   5 d                  US EDC: 09/29/2013 Subchorionic hemorrhage:  None visualized. Maternal uterus/adnexae: Uterus is anteverted. No myometrial mass lesions are demonstrated. Both ovaries are visualized and appear normal. Corpus luteal cyst on the left. No free fluid in the pelvis. IMPRESSION:  Single intrauterine pregnancy. Estimated gestational age by crown-rump length is 6 weeks 5 days. No acute complication demonstrated on ultrasound. Electronically Signed   By: Burman NievesWilliam  Stevens M.D.   On: 01/14/2016 03:58   [place] Ob Transvaginal  Result Date: 01/14/2016 CLINICAL DATA:  Abdominal cramping, back pain, and nausea for 2 days. Estimated gestational age by LMP is 6 weeks 1 day. Quantitative beta HCG is 55,826 EXAM: OBSTETRIC <14 WK US AND TRANSVAGINAL OB US TECHNIQUE: Both transabdominal and transvaginal ultrasound examinations were performed for complete evaluation of the gestation as well as the maternal uterus, adnexal regions, and pelvic cul-de-sac. Transvaginal technique was performed to assess early pregnancy. COMPARISON:  None. FINDINGS: Intrauterine gestational sac: A single intrauterine gestational sac is present. Yolk sac:  Yolk sac is present. Embryo:  Fetal pole is present. Cardiac Activity: Fetal cardiac activity is observed. Heart Rate: 128  bpm CRL:  7.9  mm   6 w   5 d                  US EDC: 09/29/2013 Subchorionic  hemorrhage:  None visualized. Maternal uterus/adnexae: Uterus is anteverted. No myometrial mass lesions are demonstrated. Both ovaries are visualized and appear normal. Corpus luteal cyst on the left. No free fluid in the pelvis. IMPRESSION: Single intrauterine pregnancy. Estimated gestational age by crown-rump length is 6 weeks 5 days. No acute complication demonstrated on ultrasound. Electronically Signed   By: Burman NievesWilliam  Stevens M.D.   On: 01/14/2016 03:58    Procedures Procedures (including critical care time)  Medications Ordered in ED Medications  cefTRIAXone (ROCEPHIN) injection 250 mg (250 mg Intramuscular Given 01/14/16 0441)  azithromycin (ZITHROMAX) tablet 1,000 mg (1,000 mg Oral Given 01/14/16 0441)  ondansetron (ZOFRAN-ODT) disintegrating tablet 4 mg (4 mg Oral Given 01/14/16 0441)  lidocaine (PF) (XYLOCAINE) 1 % injection (  Given 01/14/16 0448)     Initial Impression / Assessment and Plan / ED Course  I have reviewed the triage vital signs and the nursing notes.  Pertinent labs & imaging results that were available during my care of the patient were reviewed by me and considered in my medical decision making (see chart for details).  Clinical Course   Ultrasound with confirmed IUP. We'll treat for cervicitis. Given IM Rocephin and azithromycin in the emergency department. Patient's been informed to have all sexual partners evaluated and treated. She understands need to follow-up with her OB/GYN. Return precautions have been given   Final Clinical Impressions(s) / ED Diagnoses   Final diagnoses:  Cervicitis  Intrauterine pregnancy    New Prescriptions New Prescriptions   No medications on file     Loren Raceravid Bilbo Carcamo, MD 01/14/16 [phone]

## 2016-01-14 NOTE — ED Notes (Signed)
Pt returned from US

## 2016-01-14 NOTE — Discharge Instructions (Signed)
All sexual partners and to be seen and treated. Follow-up with her OB/GYN. Return immediately for worsening pain, fever, vaginal bleeding or for any concerns.

## 2016-01-16 LAB — GC/CHLAMYDIA PROBE AMP (~~LOC~~) NOT AT ARMC
CHLAMYDIA, DNA PROBE: NEGATIVE
Neisseria Gonorrhea: NEGATIVE

## 2016-01-16 NOTE — Telephone Encounter (Signed)
Lm with information listed in reply

## 2016-01-20 ENCOUNTER — Encounter (HOSPITAL_COMMUNITY): Payer: Self-pay | Admitting: *Deleted

## 2016-01-20 ENCOUNTER — Inpatient Hospital Stay (HOSPITAL_COMMUNITY)
Admission: AD | Admit: 2016-01-20 | Discharge: 2016-01-20 | Disposition: A | Discharge status: Home / Self Care | Payer: BLUE CROSS/BLUE SHIELD | Source: Ambulatory Visit | Attending: Obstetrics and Gynecology | Admitting: Obstetrics and Gynecology

## 2016-01-20 DIAGNOSIS — M549 Dorsalgia, unspecified: Secondary | ICD-10-CM | POA: Diagnosis not present

## 2016-01-20 DIAGNOSIS — K219 Gastro-esophageal reflux disease without esophagitis: Secondary | ICD-10-CM | POA: Insufficient documentation

## 2016-01-20 DIAGNOSIS — Z3A08 8 weeks gestation of pregnancy: Secondary | ICD-10-CM | POA: Diagnosis not present

## 2016-01-20 DIAGNOSIS — O26891 Other specified pregnancy related conditions, first trimester: Secondary | ICD-10-CM | POA: Insufficient documentation

## 2016-01-20 DIAGNOSIS — O99611 Diseases of the digestive system complicating pregnancy, first trimester: Secondary | ICD-10-CM | POA: Diagnosis not present

## 2016-01-20 LAB — URINALYSIS, ROUTINE W REFLEX MICROSCOPIC
Bilirubin Urine: NEGATIVE
Glucose, UA: NEGATIVE mg/dL
Hgb urine dipstick: NEGATIVE
Ketones, ur: NEGATIVE mg/dL
Leukocytes, UA: NEGATIVE
Nitrite: NEGATIVE
Protein, ur: NEGATIVE mg/dL
Specific Gravity, Urine: 1.02 (ref 1.005–1.030)
pH: 7 (ref 5.0–8.0)

## 2016-01-20 NOTE — Discharge Instructions (Signed)

## 2016-01-20 NOTE — MAU Provider Note (Signed)
History     CSN: [id number]  Arrival date and time: 01/20/16 0009   First Provider Initiated Contact with Patient 01/20/16 0041      Chief Complaint  Patient presents with  . Back Pain   [name] is a 25 y.o. G2P1001 at 3074w1d who presents today with back pain. She states that she has had back pain since finding out she was pregnant. She had an US on 01/14/16 and had a viable IUP at 6 weeks 5 days. Wrong EDD was noted on the US report. EDD based on that GA on that date is 09/10/15. She denies any vaginal bleeding. She states that she has had some occasional lower abdominal pain, but the pain is mostly isolated to the back. 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 The pain has been worse since then.    Back Pain  This is a new problem. The current episode started in the past 7 days. The problem occurs intermittently. The problem is unchanged. The pain is present in the lumbar spine. Radiates to: lower abdomen  The pain is at a severity of 6/10. Exacerbated by: Nothing  Pertinent negatives include no abdominal pain, dysuria or fever. Risk factors include pregnancy. She has tried nothing for the symptoms.    Past Medical History:  Diagnosis Date  . Back pain   . GERD (gastroesophageal reflux disease)     Past Surgical History:  Procedure Laterality Date  . NO PAST SURGERIES      Family History  Problem Relation Age of Onset  . Hypertension Mother   . Anesthesia problems Neg Hx   . Hypotension Neg Hx   . Malignant hyperthermia Neg Hx   . Pseudochol deficiency Neg Hx   . Other Neg Hx     Social History  Substance Use Topics  . Smoking status: Never Smoker  . Smokeless tobacco: Never Used  . Alcohol use No    Allergies: No Known Allergies  Prescriptions Prior to Admission  Medication Sig Dispense Refill Last Dose  . clotrimazole-betamethasone (LOTRISONE) cream Apply 1 application topically 2 (two) times daily. 30 g 0 01/13/2016 at  Unknown time    Review of Systems  Constitutional: Negative for chills and fever.  Gastrointestinal: Positive for nausea and vomiting. Negative for abdominal pain, constipation and diarrhea.  Genitourinary: Negative for dysuria, frequency and urgency.  Musculoskeletal: Positive for back pain.   Physical Exam   Blood pressure 110/69, pulse 108, temperature 97.8 F (36.6 C), temperature source Oral, resp. rate 17, height 5\' 1"  (1.549 m), weight 125 lb (56.7 kg), last menstrual period 12/02/2015, SpO2 100 %.  Physical Exam  Nursing note and vitals reviewed. Constitutional: She is oriented to person, place, and time. She appears well-developed and well-nourished. No distress.  HENT:  Head: Normocephalic.  Cardiovascular: Normal rate.   Respiratory: Effort normal.  GI: Soft. There is no tenderness. There is no rebound.  Genitourinary:  Genitourinary Comments: No CVA tenderness   Neurological: She is alert and oriented to person, place, and time.  Skin: Skin is warm and dry.   Bedside US shows IUP with cardiac activity.    Results for orders placed or performed during the hospital encounter of 01/20/16 (from the past 24 hour(s))  Urinalysis, Routine w reflex microscopic (not at [facility])     Status: None   Collection Time: 01/20/16 12:19 AM  Result Value Ref Range   Color, Urine YELLOW YELLOW  APPearance CLEAR CLEAR   Specific Gravity, Urine 1.020 1.005 - 1.030   pH 7.0 5.0 - 8.0   Glucose, UA NEGATIVE NEGATIVE mg/dL   Hgb urine dipstick NEGATIVE NEGATIVE   Bilirubin Urine NEGATIVE NEGATIVE   Ketones, ur NEGATIVE NEGATIVE mg/dL   Protein, ur NEGATIVE NEGATIVE mg/dL   Nitrite NEGATIVE NEGATIVE   Leukocytes, UA NEGATIVE NEGATIVE    MAU Course  Procedures  MDM   Assessment and Plan   1. Pregnancy related back pain in first trimester, antepartum   2. [redacted] weeks gestation of pregnancy    DC home Comfort measures reviewed  1st Trimester precautions  RX: none  Return to  MAU as needed FU with OB as planned  Follow-up Information    MEISINGER,TODD D, MD Follow up.   Specialty:  Obstetrics and Gynecology Contact information: [address], [address] [place] [address] [phone]            [name], [name] 01/20/2016, 12:46 AM

## 2016-01-20 NOTE — MAU Note (Signed)
Pt reports she has had lower back pain since she had a positive preg test one week ago. Denies bleeding.

## 2016-01-24 ENCOUNTER — Other Ambulatory Visit: Payer: Self-pay

## 2016-01-24 NOTE — Telephone Encounter (Signed)
Pharm called because pt was asking for a Rf of diflucan. They verified that pt did p/up the cream. Advised per Brittany's message below that if cream doesn't help she needs to RTC. Pharm agreed to tell pt.

## 2016-08-06 LAB — HM PAP SMEAR: HM Pap smear: NORMAL

## 2016-11-24 ENCOUNTER — Encounter (HOSPITAL_COMMUNITY): Payer: Self-pay

## 2016-12-01 IMAGING — US US OB COMP LESS 14 WK
1 series · 13 of 28 positions shown · non-contrast
Comparison: None.

CLINICAL DATA: Abdominal cramping, back pain, and nausea for 2
days. Estimated gestational age by LMP is 6 weeks 1 day.
Quantitative beta HCG is 55,826

EXAM:
OBSTETRIC <14 WK US AND TRANSVAGINAL OB US
TECHNIQUE: Both transabdominal and transvaginal ultrasound examinations were
performed for complete evaluation of the gestation as well as the
maternal uterus, adnexal regions, and pelvic cul-de-sac.
Transvaginal technique was performed to assess early pregnancy.

[Series 1: us ob comp less 14 wk · 0.17mm/px · 13 of 48 slices shown]
[im 2/48]
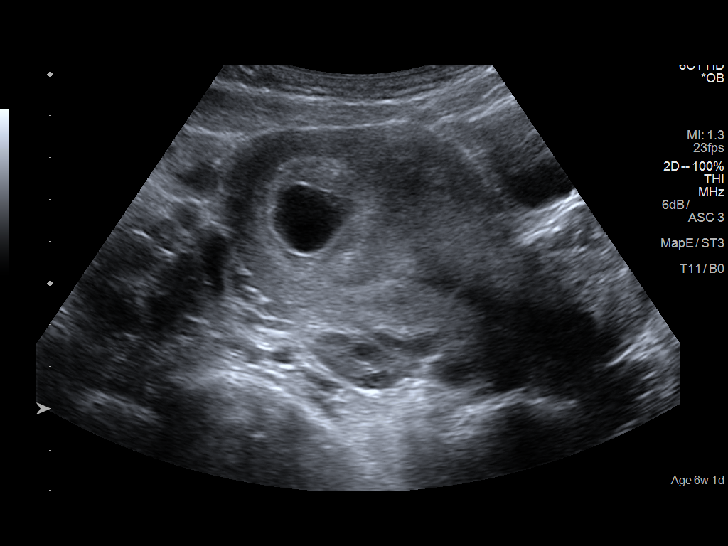
[im 6/48]
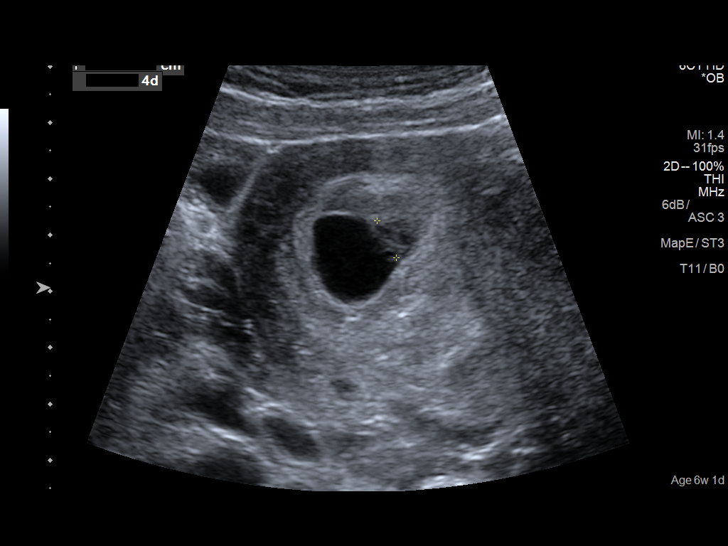
[im 9/48]
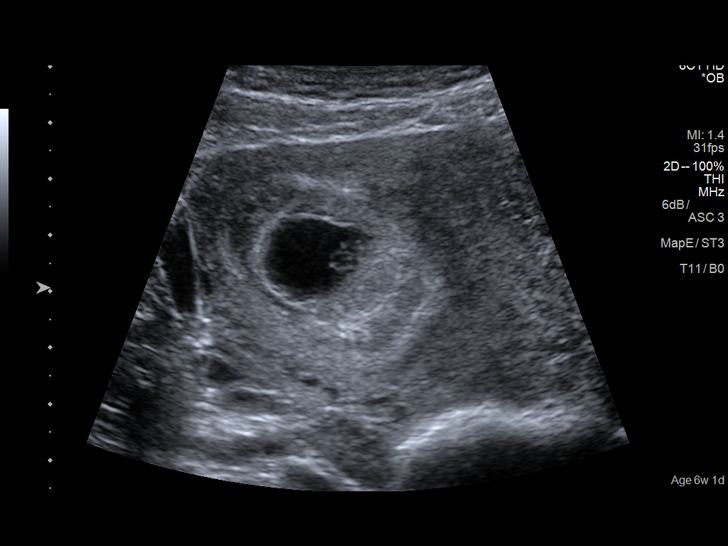
[im 13/48]
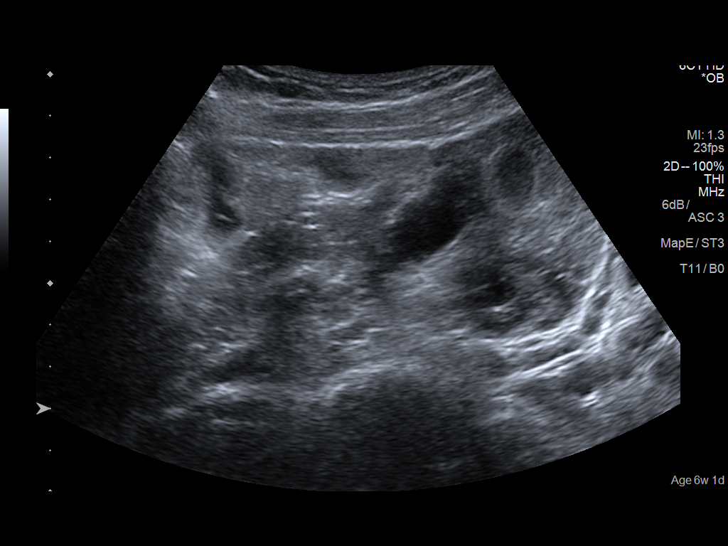
[im 16/48]
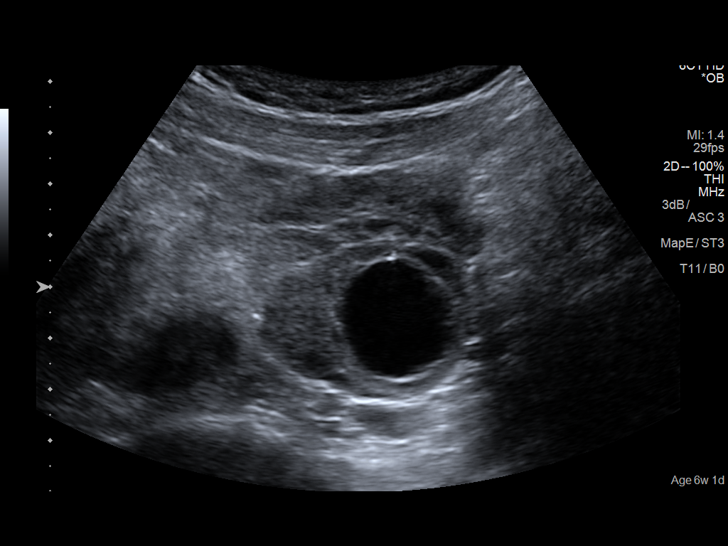
[im 20/48]
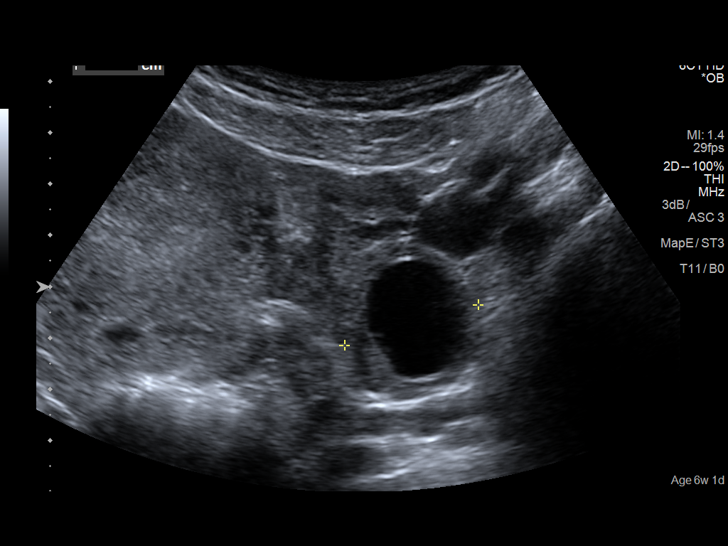
[im 25/48]
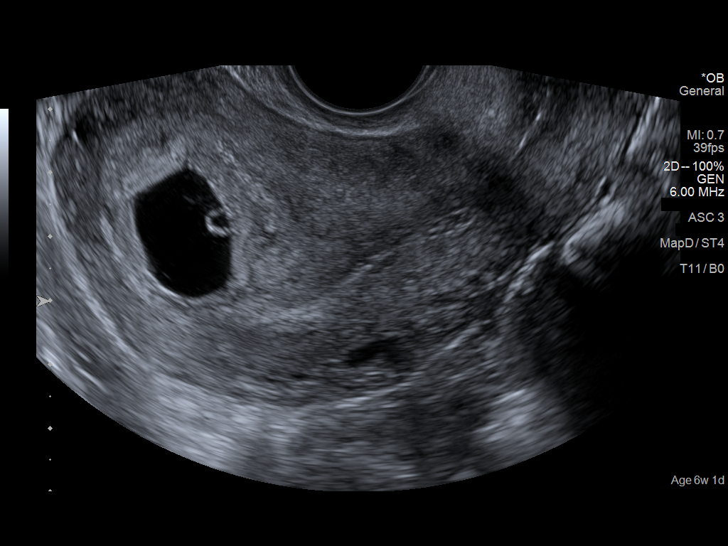
[im 28/48]
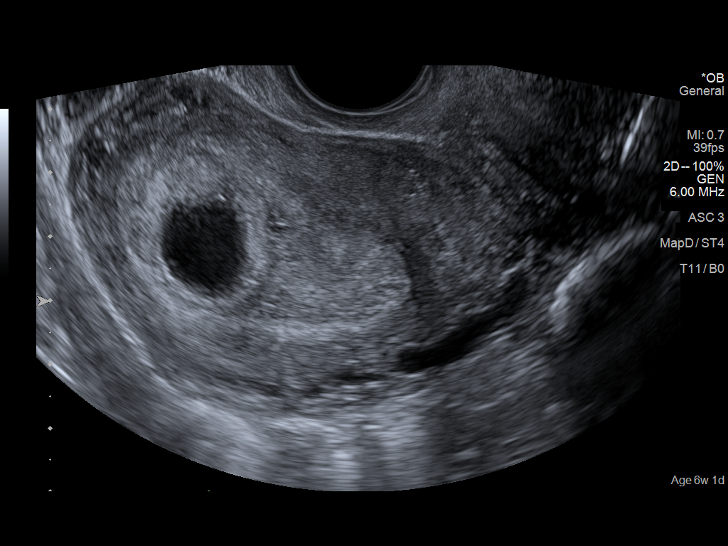
[im 32/48]
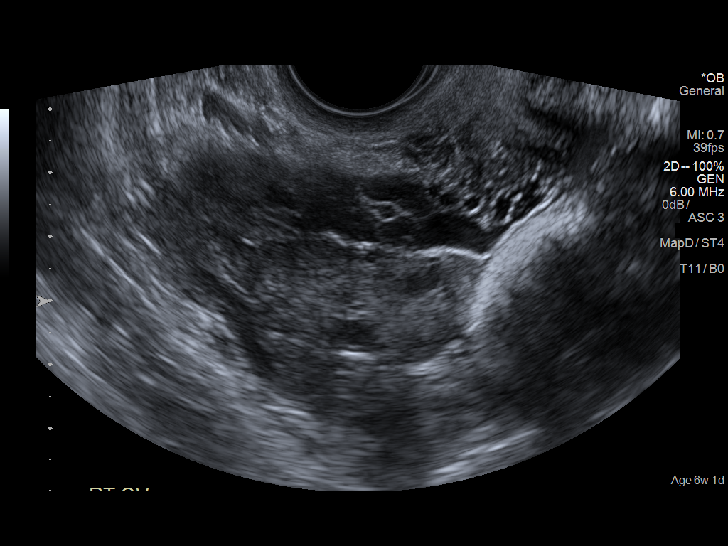
[im 35/48]
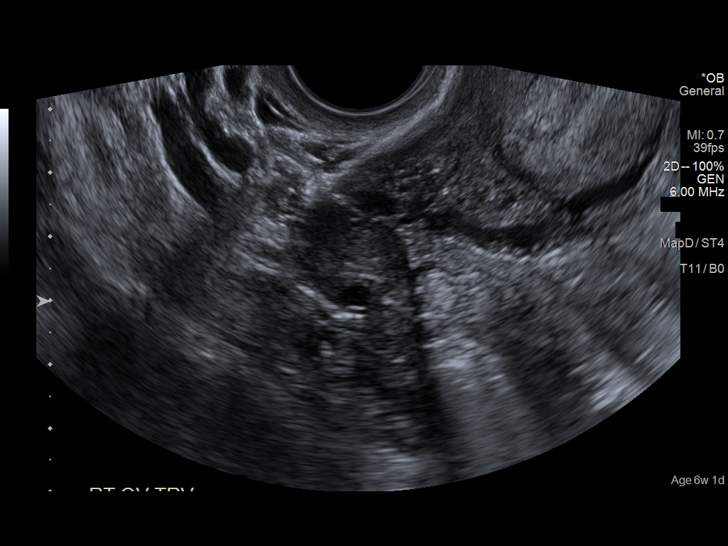
[im 39/48]
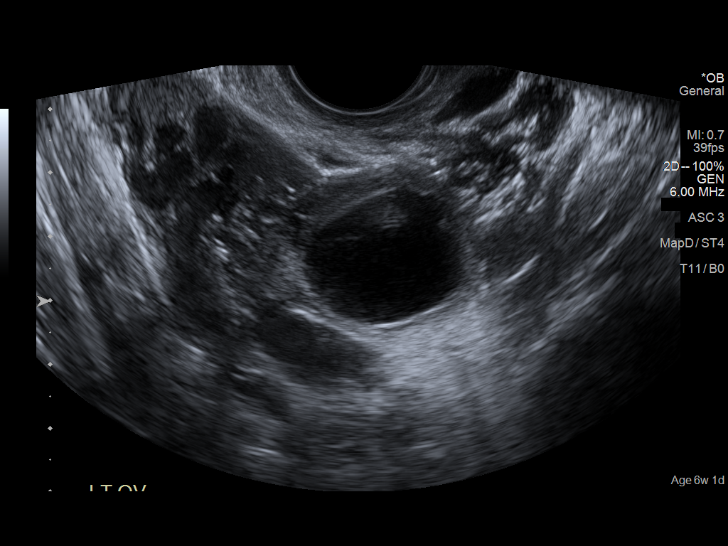
[im 42/48]
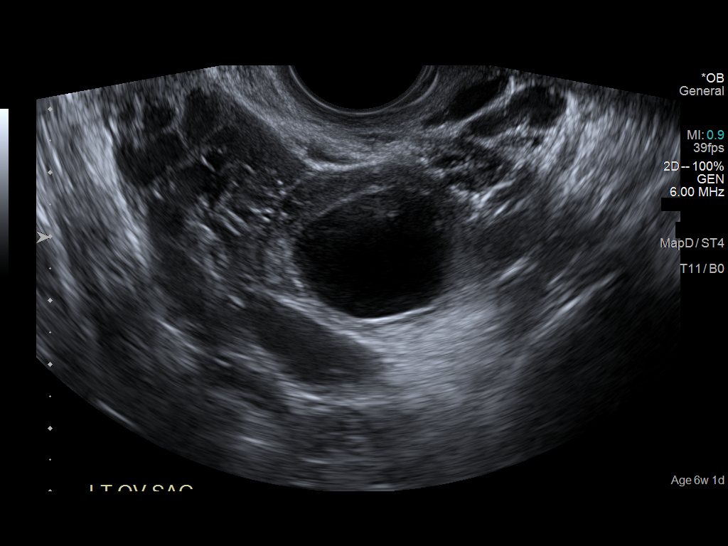
[im 46/48]
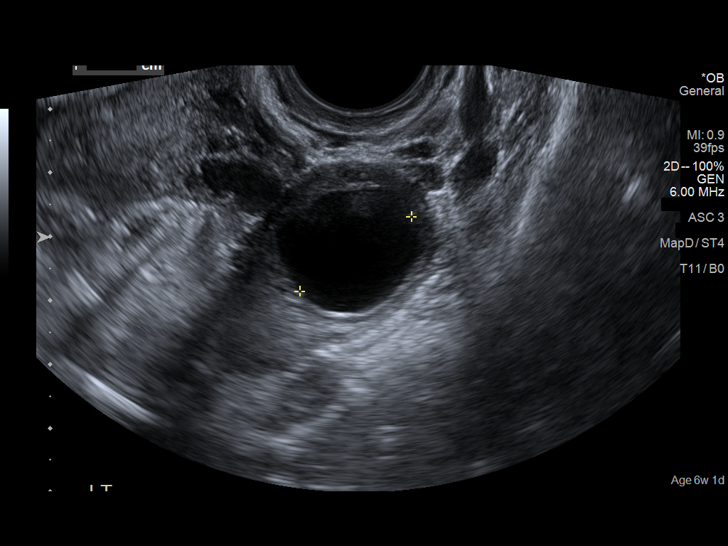

[13 of 28 positions shown; findings below may reference images not displayed]

FINDINGS: Intrauterine gestational sac: A single intrauterine gestational sac
is present.

Yolk sac:  Yolk sac is present.

Embryo:  Fetal pole is present.

Cardiac Activity: Fetal cardiac activity is observed.

Heart Rate: 128  bpm

CRL:  7.9  mm   6 w   5 d                  US EDC: 09/29/2013

Subchorionic hemorrhage:  None visualized.

Maternal uterus/adnexae: Uterus is anteverted. No myometrial mass
lesions are demonstrated. Both ovaries are visualized and appear
normal. Corpus luteal cyst on the left. No free fluid in the pelvis.
IMPRESSION: Single intrauterine pregnancy. Estimated gestational age by
crown-rump length is 6 weeks 5 days. No acute complication
demonstrated on ultrasound.

## 2017-07-31 ENCOUNTER — Ambulatory Visit (INDEPENDENT_AMBULATORY_CARE_PROVIDER_SITE_OTHER): Payer: Self-pay | Admitting: Physician Assistant

## 2017-07-31 ENCOUNTER — Encounter: Payer: Self-pay | Admitting: Physician Assistant

## 2017-07-31 ENCOUNTER — Other Ambulatory Visit: Payer: Self-pay

## 2017-07-31 VITALS — BP 114/70 | HR 69 | Temp 98.3°F | Resp 18 | Ht 63.5 in | Wt 135.0 lb

## 2017-07-31 DIAGNOSIS — N898 Other specified noninflammatory disorders of vagina: Secondary | ICD-10-CM

## 2017-07-31 DIAGNOSIS — Z202 Contact with and (suspected) exposure to infections with a predominantly sexual mode of transmission: Secondary | ICD-10-CM

## 2017-07-31 LAB — POCT WET + KOH PREP
Trich by wet prep: ABSENT
YEAST BY KOH: ABSENT
Yeast by wet prep: ABSENT

## 2017-07-31 LAB — POCT URINALYSIS DIP (MANUAL ENTRY)
Bilirubin, UA: NEGATIVE
Glucose, UA: NEGATIVE mg/dL
LEUKOCYTES UA: NEGATIVE
NITRITE UA: NEGATIVE
PH UA: 5.5 (ref 5.0–8.0)
Protein Ur, POC: NEGATIVE mg/dL
Spec Grav, UA: 1.025 (ref 1.010–1.025)
Urobilinogen, UA: 0.2 E.U./dL

## 2017-07-31 LAB — POCT URINE PREGNANCY: PREG TEST UR: NEGATIVE

## 2017-07-31 MED ORDER — AZITHROMYCIN 500 MG PO TABS: 1000.0000 mg | ORAL_TABLET | Freq: Once | ORAL | 0 refills | Status: AC

## 2017-07-31 NOTE — Progress Notes (Addendum)
[name]  MRN: [id number] DOB: 1990/10/05  Subjective:  [name] is a 27 y.o. female seen in office today for a chief complaint of vaginal discharge x1 week.  Patient is here with her sexual partner.  Of note, he has tested positive for chlamydia 2 times in the past 2 months in clinic.  After he was initially treated, he encouraged her to get tested and treated.  She told him he she did but however admits today that she never got treated for any STDs.  He would initially get better, but after intercourse with her, his symptoms would return.  She has not noticed any symptoms until the past week.  Denies vaginal odor, vaginal lesions, dysuria, urinary frequency, abdominal pain, pelvic pain, nausea, and vomiting.  Denies any new partners.  Has Nexplanon in place.  Cycles are irregular.  Has not had a cycle in past month. Partner is present with her today. No other questions or concerns today.  Review of Systems  Constitutional: Negative for chills, diaphoresis and fever.  Genitourinary: Negative for flank pain and hematuria.  Neurological: Negative for dizziness and light-headedness.    There are no active problems to display for this patient.   Current Outpatient Medications on File Prior to Visit  Medication Sig Dispense Refill  . etonogestrel (NEXPLANON) 68 MG IMPL implant 1 each by Subdermal route once.     No current facility-administered medications on file prior to visit.     No Known Allergies    Social History   Socioeconomic History  . Marital status: Single    Spouse name: Not on file  . Number of children: 1  . Years of education: Not on file  . Highest education level: Not on file  Occupational History  . Not on file  Social Needs  . Financial resource strain: Not on file  . Food insecurity:    Worry: Not on file    Inability: Not on file  . Transportation needs:    Medical: Not on file    Non-medical: Not on file  Tobacco Use  . Smoking status: Never  Smoker  . Smokeless tobacco: Never Used  Substance and Sexual Activity  . Alcohol use: No    Alcohol/week: 0.0 oz  . Drug use: No  . Sexual activity: Yes    Birth control/protection: Implant  Lifestyle  . Physical activity:    Days per week: Not on file    Minutes per session: Not on file  . Stress: Not on file  Relationships  . Social connections:    Talks on phone: Not on file    Gets together: Not on file    Attends religious service: Not on file    Active member of club or organization: Not on file    Attends meetings of clubs or organizations: Not on file    Relationship status: Not on file  . Intimate partner violence:    Fear of current or ex partner: Not on file    Emotionally abused: Not on file    Physically abused: Not on file    Forced sexual activity: Not on file  Other Topics Concern  . Not on file  Social History Narrative  . Not on file    Objective:  BP 114/70 (BP Location: Right Arm, Patient Position: Sitting, Cuff Size: Normal)   Pulse 69   Temp 98.3 F (36.8 C) (Oral)   Resp 18   Ht 5' 3.5" (1.613 m)   Wt  135 lb (61.2 kg)   SpO2 100%   Breastfeeding? No   BMI 23.54 kg/m   Physical Exam  Constitutional: She is oriented to person, place, and time. She appears well-developed and well-nourished. No distress.  HENT:  Head: Normocephalic and atraumatic.  Eyes: Conjunctivae are normal.  Neck: Normal range of motion.  Pulmonary/Chest: Effort normal.  Abdominal: Soft. Normal appearance. There is no tenderness. There is no rigidity and no guarding.  Genitourinary: Uterus normal. There is no rash or lesion on the right labia. There is no rash or lesion on the left labia. Cervix exhibits no motion tenderness and no discharge. Right adnexum displays no mass, no tenderness and no fullness. Left adnexum displays no mass, no tenderness and no fullness. Vaginal discharge (mild white creamy white discharge in vaginal canal) found.  Genitourinary Comments:  Chaperone present for exam.  Lymphadenopathy: No inguinal adenopathy noted on the right or left side.  Neurological: She is alert and oriented to person, place, and time.  Skin: Skin is warm and dry.  Psychiatric: She has a normal mood and affect.  Vitals reviewed.    Results for orders placed or performed in visit on 07/31/17 (from the past 24 hour(s))  POCT urinalysis dipstick     Status: Abnormal   Collection Time: 07/31/17 12:41 PM  Result Value Ref Range   Color, UA yellow yellow   Clarity, UA clear clear   Glucose, UA negative negative mg/dL   Bilirubin, UA negative negative   Ketones, POC UA trace (5) (A) negative mg/dL   Spec Grav, UA 1.610 9.604 - 1.025   Blood, UA trace-intact (A) negative   pH, UA 5.5 5.0 - 8.0   Protein Ur, POC negative negative mg/dL   Urobilinogen, UA 0.2 0.2 or 1.0 E.U./dL   Nitrite, UA Negative Negative   Leukocytes, UA Negative Negative  POCT Wet + KOH Prep     Status: Abnormal   Collection Time: 07/31/17  1:04 PM  Result Value Ref Range   Yeast by KOH Absent Absent   Yeast by wet prep Absent Absent   WBC by wet prep None (A) Few   Clue Cells Wet Prep HPF POC None None   Trich by wet prep Absent Absent   Bacteria Wet Prep HPF POC Many (A) Few   Epithelial Cells By [employer] [facility]) Too numerous to count  None, Few, Too numerous to count   RBC,UR,HPF,POC None None RBC/hpf  POCT urine pregnancy     Status: None   Collection Time: 07/31/17  1:07 PM  Result Value Ref Range   Preg Test, Ur Negative Negative    Assessment and Plan :  1. Chlamydia contact Labs pending. Will tx empirically for chlamydia infection at this time with azithromycin. Wet prep neg for yeast, clue cells, and trich. Preg test negative.  Do not suspect PID, as patient denies abdominal pain, pelvic pain, fever, chills, N/V.  Vitals stable. Afebrile. She is overall well-appearing, no acute distress. No CMT noted on exam. Abstain from sexual intercourse for at least 7 days after  they both are treated with abx. Follow up as needed.  - GC/Chlamydia Probe Amp - Hepatitis panel, acute - HIV antibody - RPR - POCT Wet + KOH Prep - POCT urinalysis dipstick - POCT urine pregnancy - azithromycin (ZITHROMAX) 500 MG tablet; Take 2 tablets (1,000 mg total) by mouth once for 1 dose.  Dispense: 2 tablet; Refill: 0  2. Vaginal discharge   Side effects, risks, benefits,  and alternatives of the medications and treatment plan prescribed today were discussed, and patient expressed understanding of the instructions given. No barriers to understanding were identified. Red flags discussed in detail. Pt expressed understanding regarding what to do in case of emergency/urgent symptoms.   Benjiman Core PA-C  Primary Care at [facility] 07/31/2017 1:10 PM

## 2017-07-31 NOTE — Patient Instructions (Addendum)
Go pick up Azithromycin 1 gram and take this today (or tomorrow if your pharmacy is closed). After treatment of STDs, you must remain abstinent from sex (vaginal, anal and oral) for one week.     - Safe sex is the best sex. Be responsible, be respectful and have fun.  - It is important to prevent the spread of STDs. Chlamydia is probably the most dangerous STD for ladies (not everyone has symptoms). Up to 40% of ladies with untreated chlamydia will develop pelvic inflammatory disease (PID). One in 5 women with PID become infertile.  - Women with chlamydia are 3-5 times more likely to become infected with HIV (if exposed).  - I cannot stress enough the importance of mindful and safe sex.  Please always use condoms. And have the conversation about birth control.    - Get tested after every sexual partner.  - Please consider getting the HPV vaccine.     Chlamydia, Female Chlamydia is an STD (sexually transmitted disease). This is an infection that spreads through sexual contact. If it is not treated, it can cause serious problems. It must be treated with antibiotic medicine. Sometimes, you may not have symptoms (asymptomatic). When you have symptoms, they can include:  Burning when you pee (urinate).  Peeing often.  Fluid (discharge) coming from the vagina.  Redness, soreness, and swelling (inflammation) of the butt (rectum).  Bleeding or fluid coming from the butt.  Belly (abdominal) pain.  Pain during sex.  Bleeding between periods.  Itching, burning, or redness in the eyes.  Fluid coming from the eyes.  Follow these instructions at home: Medicines  Take over-the-counter and prescription medicines only as told by your doctor.  Take your antibiotic medicine as told by your doctor. Do not stop taking the antibiotic even if you start to feel better. Sexual activity  Tell sex partners about your infection. Sex partners are people you had oral, anal, or vaginal sex with within  60 days of when you started getting sick. They need treatment, too.  Do not have sex until: ? You and your sex partners have been treated. ? Your doctor says it is okay.  If you have a single dose treatment, wait 7 days before having sex. General instructions  It is up to you to get your test results. Ask your doctor when your results will be ready.  Get a lot of rest.  Eat healthy foods.  Drink enough fluid to keep your pee (urine) clear or pale yellow.  Keep all follow-up visits as told by your doctor. You may need tests after 3 months. Preventing chlamydia  The only way to prevent chlamydia is not to have sex. To lower your risk: ? Use latex condoms correctly. Do this every time you have sex. ? Avoid having many sex partners. ? Ask if your partner has been tested for STDs and if he or she had negative results. Contact a doctor if:  You get new symptoms.  You do not get better with treatment.  You have a fever or chills.  You have pain during sex. Get help right away if:  Your pain gets worse and does not get better with medicine.  You get flu-like symptoms, such as: ? Night sweats. ? Sore throat. ? Muscle aches.  You feel sick to your stomach (nauseous).  You throw up (vomit).  You have trouble swallowing.  You have bleeding: ? Between periods. ? After sex.  You have irregular periods.  You have belly  pain that does not get better with medicine.  You have lower back pain that does not get better with medicine.  You feel weak or dizzy.  You pass out (faint).  You are pregnant and you get symptoms of chlamydia. Summary  Chlamydia is an infection that spreads through sexual contact.  Sometimes, chlamydia can cause no symptoms (asymptomatic).  Do not have sex until your doctor says it is okay.  All sex partners will have to be treated for chlamydia. This information is not intended to replace advice given to you by your health care provider. Make  sure you discuss any questions you have with your health care provider. Document Released: 11/29/2007 Document Revised: 02/09/2016 Document Reviewed: 02/09/2016 Elsevier Interactive Patient Education  2017 [employer].   IF you received an x-ray today, you will receive an invoice from [facility]. Please contact [facility] at [phone] with questions or concerns regarding your invoice.   IF you received labwork today, you will receive an invoice from [facility]. Please contact [facility] at [phone] with questions or concerns regarding your invoice.   Our billing staff will not be able to assist you with questions regarding bills from these companies.  You will be contacted with the lab results as soon as they are available. The fastest way to get your results is to activate your My Chart account. Instructions are located on the last page of this paperwork. If you have not heard from [place] regarding the results in 2 weeks, please contact this office.

## 2017-08-01 LAB — HEPATITIS PANEL, ACUTE
HEP A IGM: NEGATIVE
HEP B C IGM: NEGATIVE
HEP B S AG: NEGATIVE
Hep C Virus Ab: <0.1 s/co ratio (ref 0.0–0.9)

## 2017-08-01 LAB — HIV ANTIBODY (ROUTINE TESTING W REFLEX): HIV Screen 4th Generation wRfx: NONREACTIVE

## 2017-08-01 LAB — GC/CHLAMYDIA PROBE AMP
CHLAMYDIA, DNA PROBE: NEGATIVE
NEISSERIA GONORRHOEAE BY PCR: NEGATIVE

## 2017-08-01 LAB — RPR: RPR Ser Ql: NONREACTIVE

## 2017-08-02 ENCOUNTER — Encounter: Payer: Self-pay | Admitting: Physician Assistant

## 2017-10-26 ENCOUNTER — Emergency Department (HOSPITAL_COMMUNITY)
Admission: EM | Admit: 2017-10-26 | Discharge: 2017-10-27 | Disposition: A | Discharge status: Home / Self Care | Payer: BLUE CROSS/BLUE SHIELD | Attending: Emergency Medicine | Admitting: Emergency Medicine

## 2017-10-26 ENCOUNTER — Other Ambulatory Visit: Payer: Self-pay

## 2017-10-26 DIAGNOSIS — R21 Rash and other nonspecific skin eruption: Secondary | ICD-10-CM | POA: Insufficient documentation

## 2017-10-26 DIAGNOSIS — Z79899 Other long term (current) drug therapy: Secondary | ICD-10-CM | POA: Insufficient documentation

## 2017-10-26 NOTE — ED Triage Notes (Signed)
Patient c/o generalized rash (a few bumps seen on bilateral arms and stomach). States that it itches horribly.

## 2017-10-27 MED ORDER — PERMETHRIN 5 % EX CREA: TOPICAL_CREAM | CUTANEOUS | 0 refills | Status: DC

## 2017-10-27 MED ORDER — PREDNISONE 20 MG PO TABS: 40.0000 mg | ORAL_TABLET | Freq: Every day | ORAL | 0 refills | Status: DC

## 2017-10-27 NOTE — ED Provider Notes (Signed)
[facility] EMERGENCY DEPARTMENT Provider Note   CSN: [id number] Arrival date & time: 10/26/17  2238     History   Chief Complaint Chief Complaint  Patient presents with  . Rash    HPI [name] is a 27 y.o. female who presents with a rash.  No significant past medical history.  The patient states that the first spots were on her anterior left forearm.  She thought they would go away however they started to spread throughout her torso, back and right arm.  The rash is very itchy.  She has not taken anything because she was not sure what to do.  She does work at a nursing home.  She lives with her husband and her son who have not had a similar rash she has changed laundry detergents recently.  There is no rash on her head or lower extremities.  No fever.  No known allergies.  HPI  Past Medical History:  Diagnosis Date  . Back pain   . GERD (gastroesophageal reflux disease)     There are no active problems to display for this patient.   Past Surgical History:  Procedure Laterality Date  . NO PAST SURGERIES       OB History    Gravida  2   Para  1   Term  1   Preterm  0   AB  0   Living  1     SAB  0   TAB  0   Ectopic  0   Multiple  0   Live Births  1            Home Medications    Prior to Admission medications   Medication Sig Start Date End Date Taking? Authorizing Provider  etonogestrel (NEXPLANON) 68 MG IMPL implant 1 each by Subdermal route once.    [provider]  permethrin (ELIMITE) 5 % cream Apply to the entire body from the neck down 10/27/17   Bethel Born, PA-C  predniSONE (DELTASONE) 20 MG tablet Take 2 tablets (40 mg total) by mouth daily. 10/27/17   Bethel Born, PA-C    Family History Family History  Problem Relation Age of Onset  . Hypertension Mother   . Anesthesia problems Neg Hx   . Hypotension Neg Hx   . Malignant hyperthermia Neg Hx   . Pseudochol deficiency Neg Hx   . Other  Neg Hx     Social History Social History   Tobacco Use  . Smoking status: Never Smoker  . Smokeless tobacco: Never Used  Substance Use Topics  . Alcohol use: No    Alcohol/week: 0.0 standard drinks  . Drug use: No     Allergies   Patient has no known allergies.   Review of Systems Review of Systems  Constitutional: Negative for fever.  Skin: Positive for rash.     Physical Exam Updated Vital Signs BP 112/76 (BP Location: Right Arm)   Pulse 75   Temp 97.6 F (36.4 C) (Oral)   Resp 16   Wt 61.2 kg   SpO2 100%   BMI 23.54 kg/m   Physical Exam  Constitutional: She is oriented to person, place, and time. She appears well-developed and well-nourished. No distress.  HENT:  Head: Normocephalic and atraumatic.  Eyes: Pupils are equal, round, and reactive to light. Conjunctivae are normal. Right eye exhibits no discharge. Left eye exhibits no discharge. No scleral icterus.  Neck: Normal range of motion.  Cardiovascular: Normal rate.  Pulmonary/Chest: Effort normal. No respiratory distress.  Abdominal: She exhibits no distension.  Neurological: She is alert and oriented to person, place, and time.  Skin: Skin is warm and dry. Rash (Scattered, non-specific erythematous rash on the arms, torso which look consistent with bug bites. No burrowing between the finger web spaces) noted.  Psychiatric: She has a normal mood and affect. Her behavior is normal.  Nursing note and vitals reviewed.    ED Treatments / Results  Labs (all labs ordered are listed, but only abnormal results are displayed) Labs Reviewed - No data to display  EKG None  Radiology No results found.  Procedures Procedures (including critical care time)  Medications Ordered in ED Medications - No data to display   Initial Impression / Assessment and Plan / ED Course  I have reviewed the triage vital signs and the nursing notes.  Pertinent labs & imaging results that were available during my  care of the patient were reviewed by me and considered in my medical decision making (see chart for details).  27 year old female with non-specific itchy rash. 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 Her vitals are normal. Will give steroid burst and tx for scabies. Advised f/u it not improving.  Final Clinical Impressions(s) / ED Diagnoses   Final diagnoses:  Rash and nonspecific skin eruption    ED Discharge Orders         Ordered    permethrin (ELIMITE) 5 % cream     10/27/17 0002    predniSONE (DELTASONE) 20 MG tablet  Daily     10/27/17 0002           Bethel BornGekas, Mialani Reicks Marie, PA-C 10/27/17 Geronimo [id number]    Raeford RazorKohut, Stephen, MD 10/31/17 [phone]

## 2017-10-27 NOTE — Discharge Instructions (Addendum)
Take Prednisone for possible allergic reaction Use Benadryl (over the counter) for itching Apply Permethrin cream once before bedtime. Apply to entire body from the neck down and wash off in the morning Please return if you are worsening

## 2017-11-13 ENCOUNTER — Other Ambulatory Visit: Payer: Self-pay

## 2017-11-13 ENCOUNTER — Encounter: Payer: Self-pay | Admitting: Physician Assistant

## 2017-11-13 ENCOUNTER — Ambulatory Visit: Payer: PRIVATE HEALTH INSURANCE | Admitting: Physician Assistant

## 2017-11-13 VITALS — BP 108/68 | HR 69 | Temp 98.1°F | Resp 16 | Ht 63.0 in | Wt 136.6 lb

## 2017-11-13 DIAGNOSIS — B86 Scabies: Secondary | ICD-10-CM

## 2017-11-13 DIAGNOSIS — R21 Rash and other nonspecific skin eruption: Secondary | ICD-10-CM

## 2017-11-13 DIAGNOSIS — L299 Pruritus, unspecified: Secondary | ICD-10-CM

## 2017-11-13 LAB — POCT SCABIES: Scabies, POC: POSITIVE

## 2017-11-13 MED ORDER — PERMETHRIN 5 % EX CREA: TOPICAL_CREAM | CUTANEOUS | 0 refills | Status: DC

## 2017-11-13 MED ORDER — HYDROXYZINE HCL 25 MG PO TABS
25.0000 mg | ORAL_TABLET | Freq: Three times a day (TID) | ORAL | 0 refills | Status: DC | PRN
Start: 2017-11-13 — End: 2018-02-18

## 2017-11-13 MED ORDER — PREDNISONE 20 MG PO TABS: ORAL_TABLET | ORAL | 0 refills | Status: DC

## 2017-11-13 MED ORDER — TRIAMCINOLONE ACETONIDE 0.1 % EX CREA: 1.0000 "application " | TOPICAL_CREAM | Freq: Two times a day (BID) | CUTANEOUS | 0 refills | Status: DC

## 2017-11-13 NOTE — Progress Notes (Signed)
   [name]  MRN: [id number] DOB: [birth year]  PCP: Patient, No Pcp Per  Subjective:  Pt is a 27 year old female who presents to clinic for itchy rash on the left side of her body. She was seen in the [facility] ED on 8/24. 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 Her vitals are normal. Will give steroid burst and tx for scabies."  Prednisone made the rash start to improve, however started back up again yesterday. She does not feel that the permethrin helped. No endorses rash on the left groin and left upper inner arm. Endorses lots of itching.  She works at a nursing home.  She lives with her daughter who does not have similar rash.   Review of Systems  Constitutional: Negative for chills and fever.  Skin: Positive for rash. Negative for color change and pallor.    There are no active problems to display for this patient.   Current Outpatient Medications on File Prior to Visit  Medication Sig Dispense Refill  . etonogestrel (NEXPLANON) 68 MG IMPL implant 1 each by Subdermal route once.    . permethrin (ELIMITE) 5 % cream Apply to the entire body from the neck down (Patient not taking: Reported on 11/13/2017) 60 g 0   No current facility-administered medications on file prior to visit.     No Known Allergies   Objective:  BP 108/68 (BP Location: Right Arm, Patient Position: Sitting, Cuff Size: Normal)   Pulse 69   Temp 98.1 F (36.7 C) (Oral)   Resp 16   Ht 5\' 3"  (1.6 m)   Wt 136 lb 9.6 oz (62 kg)   SpO2 100%   BMI 24.20 kg/m   Physical Exam  Constitutional: She appears well-developed and well-nourished.  Skin: Skin is warm and dry. Rash noted.     multiple small, erythematous papules. A few excoriated nodules. Scattered brown serpiginous lines.   No involvement of web spaces of singer.    Psychiatric: She has a normal mood and affect. Her behavior is  normal. Judgment and thought content normal.   Results for orders placed or performed in visit on 11/13/17  POCT Scabies  Result Value Ref Range   Scabies, POC positive     Assessment and Plan :  1. Scabies - Pt presents c/o rash. Treated in the ED with permethrin and prednisone last week. Positive scabies scraping confirmed with microscopy. Plan to treat with topical permethrin, repeat dose/timing discussed.  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 RTC if no improvement.  - permethrin (ELIMITE) 5 % cream; Apply to the entire body from the neck down  Dispense: 60 g; Refill: 0  2. Itching - hydrOXYzine (ATARAX/VISTARIL) 25 MG tablet; Take 1 tablet (25 mg total) by mouth every 8 (eight) hours as needed for itching.  Dispense: 30 tablet; Refill: 0 - triamcinolone cream (KENALOG) 0.1 %; Apply 1 application topically 2 (two) times daily. To help control itching.  Dispense: 30 g; Refill: 0  3. Rash and nonspecific skin eruption - POCT Scabies   Marco Collie, PA-C  [facility] at [facility] 11/13/2017 8:48 AM  Please note: Portions of this report may have been transcribed using dragon voice recognition software. Every effort was made to ensure accuracy; however, inadvertent computerized transcription errors may be present.

## 2017-11-13 NOTE — Patient Instructions (Addendum)
You are positive for scabies.  Most of the time, scabies need TWO rounds of permethrin cream applications.   Be sure your daughter was not misdiagnosed with chickenpox -- this may have been scabies.   Please let your place of work know, or else this will continue to spread.    massage permethrin cream thoroughly into the skin from the neck to the soles of the feet, including areas under the fingernails and toenails. Thirty grams is usually sufficient for a single application for an average adult. Permethrin should be removed by washing (shower or bath) after 8 to 14 hours. Treatment is often performed overnight.  ALL CLOTHING AND BEDDING MUST BE WASHED IN HOT WATER OR PUT IN HOT DRYER AT THE TIME OF APPLICATION  Hydroxyzine for itching. Take this as needed  Topical Kenalog - to help control itching.   No need to fumigate your home  Individuals with scabies can return to work the day after treatment   Come back and see me if you are still not improving.    Scabies, Adult Scabies is a skin condition that happens when very small insects get under the skin (infestation). This causes a rash and severe itchiness. Scabies can spread from person to person (is contagious). If you get scabies, it is common for others in your household to get scabies too. With proper treatment, symptoms usually go away in 2-4 weeks. Scabies usually does not cause lasting problems. What are the causes? This condition is caused by mites (Sarcoptes scabiei, or human itch mites) that can only be seen with a microscope. The mites get into the top layer of skin and lay eggs. Scabies can spread from person to person through:  Close contact with a person who has scabies.  Contact with infested items, such as towels, bedding, or clothing.  What increases the risk? This condition is more likely to develop in:  People who live in nursing homes and other extended-care facilities.  People who have sexual contact with a  partner who has scabies.  Young children who attend child care facilities.  People who care for others who are at increased risk for scabies.  What are the signs or symptoms? Symptoms of this condition may include:  Severe itchiness. This is often worse at night.  A rash that includes tiny red bumps or blisters. The rash commonly occurs on the wrist, elbow, armpit, fingers, waist, groin, or buttocks. Bumps may form a line (burrow) in some areas.  Skin irritation. This can include scaly patches or sores.  How is this diagnosed? This condition is diagnosed with a physical exam. Your health care provider will look closely at your skin. In some cases, your health care provider may take a sample of your affected skin (skin scraping) and have it examined under a microscope. How is this treated? This condition may be treated with:  Medicated cream or lotion that kills the mites. This is spread on the entire body and left on for several hours. Usually, one treatment with medicated cream or lotion is enough to kill all of the mites. In severe cases, the treatment may be repeated.  Medicated cream that relieves itching.  Medicines that help to relieve itching.  Medicines that kill the mites. This treatment is rarely used.  Follow these instructions at home:  Medicines  Take or apply over-the-counter and prescription medicines as told by your health care provider.  Apply medicated cream or lotion as told by your health care provider.  Do not wash off the medicated cream or lotion until the necessary amount of time has passed. Skin Care  Avoid scratching your affected skin.  Keep your fingernails closely trimmed to reduce injury from scratching.  Take cool baths or apply cool washcloths to help reduce itching. General instructions  Clean all items that you recently had contact with, including bedding, clothing, and furniture. Do this on the same day that your treatment  starts. ? Use hot water when you wash items. ? Place unwashable items into closed, airtight plastic bags for at least 3 days. The mites cannot live for more than 3 days away from human skin. ? Vacuum furniture and mattresses that you use.  Make sure that other people who may have been infested are examined by a health care provider. These include members of your household and anyone who may have had contact with infested items.  Keep all follow-up visits as told by your health care provider. This is important. Contact a health care provider if:  You have itching that does not go away after 4 weeks of treatment.  You continue to develop new bumps or burrows.  You have redness, swelling, or pain in your rash area after treatment.  You have fluid, blood, or pus coming from your rash. This information is not intended to replace advice given to you by your health care provider. Make sure you discuss any questions you have with your health care provider. Document Released: 11/10/2014 Document Revised: 07/28/2015 Document Reviewed: 09/21/2014 Elsevier Interactive Patient Education  2018 [employer].  IF you received an x-ray today, you will receive an invoice from [facility]. Please contact [facility] at [phone] with questions or concerns regarding your invoice.   IF you received labwork today, you will receive an invoice from [facility]. Please contact [facility] at [phone] with questions or concerns regarding your invoice.   Our billing staff will not be able to assist you with questions regarding bills from these companies.  You will be contacted with the lab results as soon as they are available. The fastest way to get your results is to activate your My Chart account. Instructions are located on the last page of this paperwork. If you have not heard from [place] regarding the results in 2 weeks, please contact this office.

## 2017-11-26 ENCOUNTER — Other Ambulatory Visit: Payer: Self-pay | Admitting: Physician Assistant

## 2017-11-26 DIAGNOSIS — B86 Scabies: Secondary | ICD-10-CM

## 2017-11-26 MED ORDER — PERMETHRIN 5 % EX CREA: TOPICAL_CREAM | CUTANEOUS | 0 refills | Status: DC

## 2017-11-26 NOTE — Telephone Encounter (Signed)
Elimite 5% cream refill Last Refill:11/13/17 # 60 g  0 refills Last OV: 11/13/17 PCP: [facility] Pharmacy:[facility] [phone]  She has upcoming appt with McVey  Returned for further disposition by [facility] since she is still having a lot of itching.

## 2017-11-26 NOTE — Telephone Encounter (Signed)
Copied from CRM [phone]. Topic: Quick Communication - Rx Refill/Question >> Nov 26, 2017 12:26 PM Stephannie LiSimmons, Johntavius Shepard L, NT wrote: Medication: permethrin (ELIMITE) 5 % cream  Has the patient contacted their pharmacy? no: (Agent: If no, request that the patient contact the pharmacy for the refill. (Agent: If yes, when and what did the pharmacy advise?  Preferred Pharmacy (with phone number or street name [facility] [address] - [place], [place] - [id number] [phone] (Phone) [phone] (Fax)  The patient is still having a lot of the itching   Agent: Please be advised that RX refills may take up to 3 business days. We ask that you follow-up with your pharmacy.

## 2017-11-27 ENCOUNTER — Ambulatory Visit: Payer: PRIVATE HEALTH INSURANCE | Admitting: Emergency Medicine

## 2017-11-27 ENCOUNTER — Other Ambulatory Visit: Payer: Self-pay

## 2017-11-27 ENCOUNTER — Encounter: Payer: Self-pay | Admitting: Emergency Medicine

## 2017-11-27 DIAGNOSIS — B86 Scabies: Secondary | ICD-10-CM | POA: Diagnosis not present

## 2017-11-27 MED ORDER — PERMETHRIN 5 % EX CREA: TOPICAL_CREAM | CUTANEOUS | 0 refills | Status: DC

## 2017-11-27 NOTE — Progress Notes (Signed)
[name] 27 y.o.   Chief Complaint  Patient presents with  . scabies    follow up  . Medication Refill    Permethrin    HISTORY OF PRESENT ILLNESS: This is a 27 y.o. female was seen here on 11/13/2017 with scabies and treated with permethrin.  Here for follow-up.  Misunderstood instructions on has been using it daily for the past 14 days.  Asymptomatic.  HPI   Prior to Admission medications   Medication Sig Start Date End Date Taking? Authorizing Provider  hydrOXYzine (ATARAX/VISTARIL) 25 MG tablet Take 1 tablet (25 mg total) by mouth every 8 (eight) hours as needed for itching. 11/13/17  Yes McVey, Gelene Mink, PA-C  permethrin (ELIMITE) 5 % cream Apply to the entire body from the neck down 11/27/17  Yes Nollie Shiflett, Ines Bloomer, MD  triamcinolone cream (KENALOG) 0.1 % Apply 1 application topically 2 (two) times daily. To help control itching. 11/13/17  Yes McVey, Gelene Mink, PA-C  etonogestrel (NEXPLANON) 68 MG IMPL implant 1 each by Subdermal route once.    [provider]    No Known Allergies  There are no active problems to display for this patient.   Past Medical History:  Diagnosis Date  . Back pain   . GERD (gastroesophageal reflux disease)     Past Surgical History:  Procedure Laterality Date  . NO PAST SURGERIES      Social History   Socioeconomic History  . Marital status: Single    Spouse name: Not on file  . Number of children: 1  . Years of education: Not on file  . Highest education level: Not on file  Occupational History  . Not on file  Social Needs  . Financial resource strain: Not on file  . Food insecurity:    Worry: Not on file    Inability: Not on file  . Transportation needs:    Medical: Not on file    Non-medical: Not on file  Tobacco Use  . Smoking status: Never Smoker  . Smokeless tobacco: Never Used  Substance and Sexual Activity  . Alcohol use: No    Alcohol/week: 0.0 standard drinks  . Drug use: No  .  Sexual activity: Yes    Birth control/protection: Implant  Lifestyle  . Physical activity:    Days per week: Not on file    Minutes per session: Not on file  . Stress: Not on file  Relationships  . Social connections:    Talks on phone: Not on file    Gets together: Not on file    Attends religious service: Not on file    Active member of club or organization: Not on file    Attends meetings of clubs or organizations: Not on file    Relationship status: Not on file  . Intimate partner violence:    Fear of current or ex partner: Not on file    Emotionally abused: Not on file    Physically abused: Not on file    Forced sexual activity: Not on file  Other Topics Concern  . Not on file  Social History Narrative  . Not on file    Family History  Problem Relation Age of Onset  . Hypertension Mother   . Anesthesia problems Neg Hx   . Hypotension Neg Hx   . Malignant hyperthermia Neg Hx   . Pseudochol deficiency Neg Hx   . Other Neg Hx      Review of Systems  Constitutional:  Negative.  Negative for chills and fever.  HENT: Negative for sore throat.   Respiratory: Negative for cough and shortness of breath.   Gastrointestinal: Negative for abdominal pain, diarrhea, nausea and vomiting.  Skin: Negative.  Negative for itching and rash.  Neurological: Negative for dizziness and headaches.    Vitals:   11/27/17 1141  BP: 109/73  Pulse: 67  Resp: 16  Temp: 99 F (37.2 C)  SpO2: 98%    Physical Exam  Constitutional: She appears well-developed and well-nourished.  HENT:  Head: Normocephalic and atraumatic.  Eyes: Pupils are equal, round, and reactive to light.  Neck: Normal range of motion.  Cardiovascular: Normal rate.  Pulmonary/Chest: Effort normal.  Musculoskeletal: Normal range of motion.  Neurological: She is alert.  Skin: Skin is warm and dry. No rash noted.  Psychiatric: She has a normal mood and affect. Her behavior is normal.  Vitals  reviewed.    ASSESSMENT & PLAN: [name] was seen today for scabies and medication refill.  Diagnoses and all orders for this visit:  Scabies Comments: resolved Orders: -     Discontinue: permethrin (ELIMITE) 5 % cream; Apply to the entire body from the neck down   Patient Instructions       If you have lab work done today you will be contacted with your lab results within the next 2 weeks.  If you have not heard from [place] then please contact us. The fastest way to get your results is to register for My Chart.   IF you received an x-ray today, you will receive an invoice from [facility]. Please contact [facility] at [phone] with questions or concerns regarding your invoice.   IF you received labwork today, you will receive an invoice from [facility]. Please contact [facility] at [phone] with questions or concerns regarding your invoice.   Our billing staff will not be able to assist you with questions regarding bills from these companies.  You will be contacted with the lab results as soon as they are available. The fastest way to get your results is to activate your My Chart account. Instructions are located on the last page of this paperwork. If you have not heard from [place] regarding the results in 2 weeks, please contact this office.    Health Maintenance, Female Adopting a healthy lifestyle and getting preventive care can go a long way to promote health and wellness. Talk with your health care provider about what schedule of regular examinations is right for you. This is a good chance for you to check in with your provider about disease prevention and staying healthy. In between checkups, there are plenty of things you can do on your own. Experts have done a lot of research about which lifestyle changes and preventive measures are most likely to keep you healthy. Ask your health care provider for more information. Weight and diet Eat a healthy diet  Be  sure to include plenty of vegetables, fruits, low-fat dairy products, and lean protein.  Do not eat a lot of foods high in solid fats, added sugars, or salt.  Get regular exercise. This is one of the most important things you can do for your health. ? Most adults should exercise for at least 150 minutes each week. The exercise should increase your heart rate and make you sweat (moderate-intensity exercise). ? Most adults should also do strengthening exercises at least twice a week. This is in addition to the moderate-intensity exercise.  Maintain a healthy weight  Body mass index (BMI) is a measurement that can be used to identify possible weight problems. It estimates body fat based on height and weight. Your health care provider can help determine your BMI and help you achieve or maintain a healthy weight.  For females 71 years of age and older: ? A BMI below 18.5 is considered underweight. ? A BMI of 18.5 to 24.9 is normal. ? A BMI of 25 to 29.9 is considered overweight. ? A BMI of 30 and above is considered obese.  Watch levels of cholesterol and blood lipids  You should start having your blood tested for lipids and cholesterol at 26 years of age, then have this test every 5 years.  You may need to have your cholesterol levels checked more often if: ? Your lipid or cholesterol levels are high. ? You are older than 26 years of age. ? You are at high risk for heart disease.  Cancer screening Lung Cancer  Lung cancer screening is recommended for adults 91-28 years old who are at high risk for lung cancer because of a history of smoking.  A yearly low-dose CT scan of the lungs is recommended for people who: ? Currently smoke. ? Have quit within the past 15 years. ? Have at least a 30-pack-year history of smoking. A pack year is smoking an average of one pack of cigarettes a day for 1 year.  Yearly screening should continue until it has been 15 years since you quit.  Yearly  screening should stop if you develop a health problem that would prevent you from having lung cancer treatment.  Breast Cancer  Practice breast self-awareness. This means understanding how your breasts normally appear and feel.  It also means doing regular breast self-exams. Let your health care provider know about any changes, no matter how small.  If you are in your 20s or 30s, you should have a clinical breast exam (CBE) by a health care provider every 1-3 years as part of a regular health exam.  If you are 65 or older, have a CBE every year. Also consider having a breast X-ray (mammogram) every year.  If you have a family history of breast cancer, talk to your health care provider about genetic screening.  If you are at high risk for breast cancer, talk to your health care provider about having an MRI and a mammogram every year.  Breast cancer gene (BRCA) assessment is recommended for women who have family members with BRCA-related cancers. BRCA-related cancers include: ? Breast. ? Ovarian. ? Tubal. ? Peritoneal cancers.  Results of the assessment will determine the need for genetic counseling and BRCA1 and BRCA2 testing.  Cervical Cancer Your health care provider may recommend that you be screened regularly for cancer of the pelvic organs (ovaries, uterus, and vagina). This screening involves a pelvic examination, including checking for microscopic changes to the surface of your cervix (Pap test). You may be encouraged to have this screening done every 3 years, beginning at age 77.  For women ages 17-65, health care providers may recommend pelvic exams and Pap testing every 3 years, or they may recommend the Pap and pelvic exam, combined with testing for human papilloma virus (HPV), every 5 years. Some types of HPV increase your risk of cervical cancer. Testing for HPV may also be done on women of any age with unclear Pap test results.  Other health care providers may not recommend  any screening for nonpregnant women who are considered low risk  for pelvic cancer and who do not have symptoms. Ask your health care provider if a screening pelvic exam is right for you.  If you have had past treatment for cervical cancer or a condition that could lead to cancer, you need Pap tests and screening for cancer for at least 20 years after your treatment. If Pap tests have been discontinued, your risk factors (such as having a new sexual partner) need to be reassessed to determine if screening should resume. Some women have medical problems that increase the chance of getting cervical cancer. In these cases, your health care provider may recommend more frequent screening and Pap tests.  Colorectal Cancer  This type of cancer can be detected and often prevented.  Routine colorectal cancer screening usually begins at 26 years of age and continues through 26 years of age.  Your health care provider may recommend screening at an earlier age if you have risk factors for colon cancer.  Your health care provider may also recommend using home test kits to check for hidden blood in the stool.  A small camera at the end of a tube can be used to examine your colon directly (sigmoidoscopy or colonoscopy). This is done to check for the earliest forms of colorectal cancer.  Routine screening usually begins at age 61.  Direct examination of the colon should be repeated every 5-10 years through 26 years of age. However, you may need to be screened more often if early forms of precancerous polyps or small growths are found.  Skin Cancer  Check your skin from head to toe regularly.  Tell your health care provider about any new moles or changes in moles, especially if there is a change in a mole's shape or color.  Also tell your health care provider if you have a mole that is larger than the size of a pencil eraser.  Always use sunscreen. Apply sunscreen liberally and repeatedly throughout the  day.  Protect yourself by wearing long sleeves, pants, a wide-brimmed hat, and sunglasses whenever you are outside.  Heart disease, diabetes, and high blood pressure  High blood pressure causes heart disease and increases the risk of stroke. High blood pressure is more likely to develop in: ? People who have blood pressure in the high end of the normal range (130-139/85-89 mm Hg). ? People who are overweight or obese. ? People who are African American.  If you are 26-35 years of age, have your blood pressure checked every 3-5 years. If you are 36 years of age or older, have your blood pressure checked every year. You should have your blood pressure measured twice-once when you are at a hospital or clinic, and once when you are not at a hospital or clinic. Record the average of the two measurements. To check your blood pressure when you are not at a hospital or clinic, you can use: ? An automated blood pressure machine at a pharmacy. ? A home blood pressure monitor.  If you are between 43 years and 21 years old, ask your health care provider if you should take aspirin to prevent strokes.  Have regular diabetes screenings. This involves taking a blood sample to check your fasting blood sugar level. ? If you are at a normal weight and have a low risk for diabetes, have this test once every three years after 26 years of age. ? If you are overweight and have a high risk for diabetes, consider being tested at a younger age or more  often. Preventing infection Hepatitis B  If you have a higher risk for hepatitis B, you should be screened for this virus. You are considered at high risk for hepatitis B if: ? You were born in a country where hepatitis B is common. Ask your health care provider which countries are considered high risk. ? Your parents were born in a high-risk country, and you have not been immunized against hepatitis B (hepatitis B vaccine). ? You have HIV or AIDS. ? You use needles to  inject street drugs. ? You live with someone who has hepatitis B. ? You have had sex with someone who has hepatitis B. ? You get hemodialysis treatment. ? You take certain medicines for conditions, including cancer, organ transplantation, and autoimmune conditions.  Hepatitis C  Blood testing is recommended for: ? Everyone born from [birth year] through 1964. ? Anyone with known risk factors for hepatitis C.  Sexually transmitted infections (STIs)  You should be screened for sexually transmitted infections (STIs) including gonorrhea and chlamydia if: ? You are sexually active and are younger than 26 years of age. ? You are older than 26 years of age and your health care provider tells you that you are at risk for this type of infection. ? Your sexual activity has changed since you were last screened and you are at an increased risk for chlamydia or gonorrhea. Ask your health care provider if you are at risk.  If you do not have HIV, but are at risk, it may be recommended that you take a prescription medicine daily to prevent HIV infection. This is called pre-exposure prophylaxis (PrEP). You are considered at risk if: ? You are sexually active and do not regularly use condoms or know the HIV status of your partner(s). ? You take drugs by injection. ? You are sexually active with a partner who has HIV.  Talk with your health care provider about whether you are at high risk of being infected with HIV. If you choose to begin PrEP, you should first be tested for HIV. You should then be tested every 3 months for as long as you are taking PrEP. Pregnancy  If you are premenopausal and you may become pregnant, ask your health care provider about preconception counseling.  If you may become pregnant, take 400 to 800 micrograms (mcg) of folic acid every day.  If you want to prevent pregnancy, talk to your health care provider about birth control (contraception). Osteoporosis and  menopause  Osteoporosis is a disease in which the bones lose minerals and strength with aging. This can result in serious bone fractures. Your risk for osteoporosis can be identified using a bone density scan.  If you are 25 years of age or older, or if you are at risk for osteoporosis and fractures, ask your health care provider if you should be screened.  Ask your health care provider whether you should take a calcium or vitamin D supplement to lower your risk for osteoporosis.  Menopause may have certain physical symptoms and risks.  Hormone replacement therapy may reduce some of these symptoms and risks. Talk to your health care provider about whether hormone replacement therapy is right for you. Follow these instructions at home:  Schedule regular health, dental, and eye exams.  Stay current with your immunizations.  Do not use any tobacco products including cigarettes, chewing tobacco, or electronic cigarettes.  If you are pregnant, do not drink alcohol.  If you are breastfeeding, limit how much and how often  you drink alcohol.  Limit alcohol intake to no more than 1 drink per day for nonpregnant women. One drink equals 12 ounces of beer, 5 ounces of wine, or 1 ounces of hard liquor.  Do not use street drugs.  Do not share needles.  Ask your health care provider for help if you need support or information about quitting drugs.  Tell your health care provider if you often feel depressed.  Tell your health care provider if you have ever been abused or do not feel safe at home. This information is not intended to replace advice given to you by your health care provider. Make sure you discuss any questions you have with your health care provider. Document Released: 09/04/2010 Document Revised: 07/28/2015 Document Reviewed: 11/23/2014 [employer] Interactive Patient Education  2018 [employer]      Agustina Caroli, MD [facility]

## 2017-11-27 NOTE — Patient Instructions (Addendum)
If you have lab work done today you will be contacted with your lab results within the next 2 weeks.  If you have not heard from [place] then please contact us. The fastest way to get your results is to register for My Chart.   IF you received an x-ray today, you will receive an invoice from [facility]. Please contact [facility] at [phone] with questions or concerns regarding your invoice.   IF you received labwork today, you will receive an invoice from [facility]. Please contact [facility] at [phone] with questions or concerns regarding your invoice.   Our billing staff will not be able to assist you with questions regarding bills from these companies.  You will be contacted with the lab results as soon as they are available. The fastest way to get your results is to activate your My Chart account. Instructions are located on the last page of this paperwork. If you have not heard from [place] regarding the results in 2 weeks, please contact this office.    Health Maintenance, Female Adopting a healthy lifestyle and getting preventive care can go a long way to promote health and wellness. Talk with your health care provider about what schedule of regular examinations is right for you. This is a good chance for you to check in with your provider about disease prevention and staying healthy. In between checkups, there are plenty of things you can do on your own. Experts have done a lot of research about which lifestyle changes and preventive measures are most likely to keep you healthy. Ask your health care provider for more information. Weight and diet Eat a healthy diet  Be sure to include plenty of vegetables, fruits, low-fat dairy products, and lean protein.  Do not eat a lot of foods high in solid fats, added sugars, or salt.  Get regular exercise. This is one of the most important things you can do for your health. ? Most adults should exercise for at least 150  minutes each week. The exercise should increase your heart rate and make you sweat (moderate-intensity exercise). ? Most adults should also do strengthening exercises at least twice a week. This is in addition to the moderate-intensity exercise.  Maintain a healthy weight  Body mass index (BMI) is a measurement that can be used to identify possible weight problems. It estimates body fat based on height and weight. Your health care provider can help determine your BMI and help you achieve or maintain a healthy weight.  For females 90 years of age and older: ? A BMI below 18.5 is considered underweight. ? A BMI of 18.5 to 24.9 is normal. ? A BMI of 25 to 29.9 is considered overweight. ? A BMI of 30 and above is considered obese.  Watch levels of cholesterol and blood lipids  You should start having your blood tested for lipids and cholesterol at 26 years of age, then have this test every 5 years.  You may need to have your cholesterol levels checked more often if: ? Your lipid or cholesterol levels are high. ? You are older than 26 years of age. ? You are at high risk for heart disease.  Cancer screening Lung Cancer  Lung cancer screening is recommended for adults 69-37 years old who are at high risk for lung cancer because of a history of smoking.  A yearly low-dose CT scan of the lungs is recommended for people who: ? Currently smoke. ? Have quit within  the past 15 years. ? Have at least a 30-pack-year history of smoking. A pack year is smoking an average of one pack of cigarettes a day for 1 year.  Yearly screening should continue until it has been 15 years since you quit.  Yearly screening should stop if you develop a health problem that would prevent you from having lung cancer treatment.  Breast Cancer  Practice breast self-awareness. This means understanding how your breasts normally appear and feel.  It also means doing regular breast self-exams. Let your health care  provider know about any changes, no matter how small.  If you are in your 20s or 30s, you should have a clinical breast exam (CBE) by a health care provider every 1-3 years as part of a regular health exam.  If you are 40 or older, have a CBE every year. Also consider having a breast X-ray (mammogram) every year.  If you have a family history of breast cancer, talk to your health care provider about genetic screening.  If you are at high risk for breast cancer, talk to your health care provider about having an MRI and a mammogram every year.  Breast cancer gene (BRCA) assessment is recommended for women who have family members with BRCA-related cancers. BRCA-related cancers include: ? Breast. ? Ovarian. ? Tubal. ? Peritoneal cancers.  Results of the assessment will determine the need for genetic counseling and BRCA1 and BRCA2 testing.  Cervical Cancer Your health care provider may recommend that you be screened regularly for cancer of the pelvic organs (ovaries, uterus, and vagina). This screening involves a pelvic examination, including checking for microscopic changes to the surface of your cervix (Pap test). You may be encouraged to have this screening done every 3 years, beginning at age 21.  For women ages 30-65, health care providers may recommend pelvic exams and Pap testing every 3 years, or they may recommend the Pap and pelvic exam, combined with testing for human papilloma virus (HPV), every 5 years. Some types of HPV increase your risk of cervical cancer. Testing for HPV may also be done on women of any age with unclear Pap test results.  Other health care providers may not recommend any screening for nonpregnant women who are considered low risk for pelvic cancer and who do not have symptoms. Ask your health care provider if a screening pelvic exam is right for you.  If you have had past treatment for cervical cancer or a condition that could lead to cancer, you need Pap tests  and screening for cancer for at least 20 years after your treatment. If Pap tests have been discontinued, your risk factors (such as having a new sexual partner) need to be reassessed to determine if screening should resume. Some women have medical problems that increase the chance of getting cervical cancer. In these cases, your health care provider may recommend more frequent screening and Pap tests.  Colorectal Cancer  This type of cancer can be detected and often prevented.  Routine colorectal cancer screening usually begins at 26 years of age and continues through 26 years of age.  Your health care provider may recommend screening at an earlier age if you have risk factors for colon cancer.  Your health care provider may also recommend using home test kits to check for hidden blood in the stool.  A small camera at the end of a tube can be used to examine your colon directly (sigmoidoscopy or colonoscopy). This is done to check for   the earliest forms of colorectal cancer.  Routine screening usually begins at age 50.  Direct examination of the colon should be repeated every 5-10 years through 26 years of age. However, you may need to be screened more often if early forms of precancerous polyps or small growths are found.  Skin Cancer  Check your skin from head to toe regularly.  Tell your health care provider about any new moles or changes in moles, especially if there is a change in a mole's shape or color.  Also tell your health care provider if you have a mole that is larger than the size of a pencil eraser.  Always use sunscreen. Apply sunscreen liberally and repeatedly throughout the day.  Protect yourself by wearing long sleeves, pants, a wide-brimmed hat, and sunglasses whenever you are outside.  Heart disease, diabetes, and high blood pressure  High blood pressure causes heart disease and increases the risk of stroke. High blood pressure is more likely to develop  in: ? People who have blood pressure in the high end of the normal range (130-139/85-89 mm Hg). ? People who are overweight or obese. ? People who are African American.  If you are 18-39 years of age, have your blood pressure checked every 3-5 years. If you are 40 years of age or older, have your blood pressure checked every year. You should have your blood pressure measured twice-once when you are at a hospital or clinic, and once when you are not at a hospital or clinic. Record the average of the two measurements. To check your blood pressure when you are not at a hospital or clinic, you can use: ? An automated blood pressure machine at a pharmacy. ? A home blood pressure monitor.  If you are between 55 years and 79 years old, ask your health care provider if you should take aspirin to prevent strokes.  Have regular diabetes screenings. This involves taking a blood sample to check your fasting blood sugar level. ? If you are at a normal weight and have a low risk for diabetes, have this test once every three years after 26 years of age. ? If you are overweight and have a high risk for diabetes, consider being tested at a younger age or more often. Preventing infection Hepatitis B  If you have a higher risk for hepatitis B, you should be screened for this virus. You are considered at high risk for hepatitis B if: ? You were born in a country where hepatitis B is common. Ask your health care provider which countries are considered high risk. ? Your parents were born in a high-risk country, and you have not been immunized against hepatitis B (hepatitis B vaccine). ? You have HIV or AIDS. ? You use needles to inject street drugs. ? You live with someone who has hepatitis B. ? You have had sex with someone who has hepatitis B. ? You get hemodialysis treatment. ? You take certain medicines for conditions, including cancer, organ transplantation, and autoimmune conditions.  Hepatitis C  Blood  testing is recommended for: ? Everyone born from 1945 through 1965. ? Anyone with known risk factors for hepatitis C.  Sexually transmitted infections (STIs)  You should be screened for sexually transmitted infections (STIs) including gonorrhea and chlamydia if: ? You are sexually active and are younger than 26 years of age. ? You are older than 26 years of age and your health care provider tells you that you are at risk for this   type of infection. ? Your sexual activity has changed since you were last screened and you are at an increased risk for chlamydia or gonorrhea. Ask your health care provider if you are at risk.  If you do not have HIV, but are at risk, it may be recommended that you take a prescription medicine daily to prevent HIV infection. This is called pre-exposure prophylaxis (PrEP). You are considered at risk if: ? You are sexually active and do not regularly use condoms or know the HIV status of your partner(s). ? You take drugs by injection. ? You are sexually active with a partner who has HIV.  Talk with your health care provider about whether you are at high risk of being infected with HIV. If you choose to begin PrEP, you should first be tested for HIV. You should then be tested every 3 months for as long as you are taking PrEP. Pregnancy  If you are premenopausal and you may become pregnant, ask your health care provider about preconception counseling.  If you may become pregnant, take 400 to 800 micrograms (mcg) of folic acid every day.  If you want to prevent pregnancy, talk to your health care provider about birth control (contraception). Osteoporosis and menopause  Osteoporosis is a disease in which the bones lose minerals and strength with aging. This can result in serious bone fractures. Your risk for osteoporosis can be identified using a bone density scan.  If you are 28 years of age or older, or if you are at risk for osteoporosis and fractures, ask your  health care provider if you should be screened.  Ask your health care provider whether you should take a calcium or vitamin D supplement to lower your risk for osteoporosis.  Menopause may have certain physical symptoms and risks.  Hormone replacement therapy may reduce some of these symptoms and risks. Talk to your health care provider about whether hormone replacement therapy is right for you. Follow these instructions at home:  Schedule regular health, dental, and eye exams.  Stay current with your immunizations.  Do not use any tobacco products including cigarettes, chewing tobacco, or electronic cigarettes.  If you are pregnant, do not drink alcohol.  If you are breastfeeding, limit how much and how often you drink alcohol.  Limit alcohol intake to no more than 1 drink per day for nonpregnant women. One drink equals 12 ounces of beer, 5 ounces of wine, or 1 ounces of hard liquor.  Do not use street drugs.  Do not share needles.  Ask your health care provider for help if you need support or information about quitting drugs.  Tell your health care provider if you often feel depressed.  Tell your health care provider if you have ever been abused or do not feel safe at home. This information is not intended to replace advice given to you by your health care provider. Make sure you discuss any questions you have with your health care provider. Document Released: 09/04/2010 Document Revised: 07/28/2015 Document Reviewed: 11/23/2014 Elsevier Interactive Patient Education  [employer].

## 2017-12-04 ENCOUNTER — Ambulatory Visit: Payer: PRIVATE HEALTH INSURANCE | Admitting: Family Medicine

## 2017-12-04 ENCOUNTER — Other Ambulatory Visit: Payer: Self-pay

## 2017-12-04 ENCOUNTER — Encounter: Payer: Self-pay | Admitting: Family Medicine

## 2017-12-04 VITALS — BP 113/74 | HR 67 | Temp 98.2°F | Resp 18 | Ht 63.0 in | Wt 132.6 lb

## 2017-12-04 DIAGNOSIS — B86 Scabies: Secondary | ICD-10-CM | POA: Diagnosis not present

## 2017-12-04 DIAGNOSIS — L299 Pruritus, unspecified: Secondary | ICD-10-CM

## 2017-12-04 MED ORDER — DOXEPIN HCL 10 MG PO CAPS: 10.0000 mg | ORAL_CAPSULE | Freq: Every day | ORAL | 1 refills | Status: DC

## 2017-12-04 MED ORDER — IVERMECTIN 3 MG PO TABS: 200.0000 ug/kg | ORAL_TABLET | Freq: Once | ORAL | 0 refills | Status: AC

## 2017-12-04 NOTE — Progress Notes (Signed)
Patient ID: [name], female    DOB: 1990-09-26  Age: 27 y.o. MRN: [id number]  Chief Complaint  Patient presents with  . scabies    follow up on itching     Subjective:   Patient has a history of having had scabies that she has been checked for a couple of times recently.  It is now making her feel very anxious and like things are crawling on her.  She is used permethrin in the past.  She is now getting the sensation of things crawling all over her.  She is having trouble resting.  She has become very anxious with it.  Her daughter got some medicine for it even though she may or may not actually have any lesions.  The patient is a Lawyer, working at [employer].  Current allergies, medications, problem list, past/family and social histories reviewed.  Objective:  BP 113/74   Pulse 67   Temp 98.2 F (36.8 C) (Oral)   Resp 18   Ht 5\' 3"  (1.6 m)   Wt 132 lb 9.6 oz (60.1 kg)   SpO2 98%   BMI 23.49 kg/m   No major acute distress.  Has a little rash on her forearms that looks like scabies.  She also complains in her axilla but the axillary areas have follicles and a little cystic folliculitis but I do not see anything that I can cause scabies there.  None around the waistline her back or abdomen.  Assessment & Plan:   Assessment: 1. Scabies   2. Itching       Plan: We will treat orally  No orders of the defined types were placed in this encounter.   Meds ordered this encounter  Medications  . ivermectin (STROMECTOL) 3 MG TABS tablet    Sig: Take 4 tablets (12,000 mcg total) by mouth once for 1 dose.    Dispense:  4 tablet    Refill:  0  . doxepin (SINEQUAN) 10 MG capsule    Sig: Take 1 capsule (10 mg total) by mouth at bedtime.    Dispense:  30 capsule    Refill:  1         Patient Instructions      Take the ivermectin pills for pills and a single dose today for the scabies.  It may take several days for your symptoms to start improving.  One time dose is all  that is generally recommended.  Take the doxepin 1 or 2 pills at bedtime for itching.  This will also help you to sleep a little better.  Return if not improving.   Scabies, Adult Scabies is a skin condition that happens when very small insects get under the skin (infestation). This causes a rash and severe itchiness. Scabies can spread from person to person (is contagious). If you get scabies, it is common for others in your household to get scabies too. With proper treatment, symptoms usually go away in 2-4 weeks. Scabies usually does not cause lasting problems. What are the causes? This condition is caused by mites (Sarcoptes scabiei, or human itch mites) that can only be seen with a microscope. The mites get into the top layer of skin and lay eggs. Scabies can spread from person to person through:  Close contact with a person who has scabies.  Contact with infested items, such as towels, bedding, or clothing.  What increases the risk? This condition is more likely to develop in:  People who live in nursing homes  and other extended-care facilities.  People who have sexual contact with a partner who has scabies.  Young children who attend child care facilities.  People who care for others who are at increased risk for scabies.  What are the signs or symptoms? Symptoms of this condition may include:  Severe itchiness. This is often worse at night.  A rash that includes tiny red bumps or blisters. The rash commonly occurs on the wrist, elbow, armpit, fingers, waist, groin, or buttocks. Bumps may form a line (burrow) in some areas.  Skin irritation. This can include scaly patches or sores.  How is this diagnosed? This condition is diagnosed with a physical exam. Your health care provider will look closely at your skin. In some cases, your health care provider may take a sample of your affected skin (skin scraping) and have it examined under a microscope. How is this  treated? This condition may be treated with:  Medicated cream or lotion that kills the mites. This is spread on the entire body and left on for several hours. Usually, one treatment with medicated cream or lotion is enough to kill all of the mites. In severe cases, the treatment may be repeated.  Medicated cream that relieves itching.  Medicines that help to relieve itching.  Medicines that kill the mites. This treatment is rarely used.  Follow these instructions at home:  Medicines  Take or apply over-the-counter and prescription medicines as told by your health care provider.  Apply medicated cream or lotion as told by your health care provider.  Do not wash off the medicated cream or lotion until the necessary amount of time has passed. Skin Care  Avoid scratching your affected skin.  Keep your fingernails closely trimmed to reduce injury from scratching.  Take cool baths or apply cool washcloths to help reduce itching. General instructions  Clean all items that you recently had contact with, including bedding, clothing, and furniture. Do this on the same day that your treatment starts. ? Use hot water when you wash items. ? Place unwashable items into closed, airtight plastic bags for at least 3 days. The mites cannot live for more than 3 days away from human skin. ? Vacuum furniture and mattresses that you use.  Make sure that other people who may have been infested are examined by a health care provider. These include members of your household and anyone who may have had contact with infested items.  Keep all follow-up visits as told by your health care provider. This is important. Contact a health care provider if:  You have itching that does not go away after 4 weeks of treatment.  You continue to develop new bumps or burrows.  You have redness, swelling, or pain in your rash area after treatment.  You have fluid, blood, or pus coming from your rash. This  information is not intended to replace advice given to you by your health care provider. Make sure you discuss any questions you have with your health care provider. Document Released: 11/10/2014 Document Revised: 07/28/2015 Document Reviewed: 09/21/2014 Elsevier Interactive Patient Education  [employer].    If you have lab work done today you will be contacted with your lab results within the next 2 weeks.  If you have not heard from [place] then please contact us. The fastest way to get your results is to register for My Chart.   IF you received an x-ray today, you will receive an invoice from [facility]. Please contact  [facility] at [phone] with questions or concerns regarding your invoice.   IF you received labwork today, you will receive an invoice from [facility]. Please contact [facility] at [phone] with questions or concerns regarding your invoice.   Our billing staff will not be able to assist you with questions regarding bills from these companies.  You will be contacted with the lab results as soon as they are available. The fastest way to get your results is to activate your My Chart account. Instructions are located on the last page of this paperwork. If you have not heard from [place] regarding the results in 2 weeks, please contact this office.        Return if symptoms worsen or fail to improve.   Janace Hoard, MD 12/04/2017

## 2017-12-04 NOTE — Patient Instructions (Addendum)
Take the ivermectin pills for pills and a single dose today for the scabies.  It may take several days for your symptoms to start improving.  One time dose is all that is generally recommended.  Take the doxepin 1 or 2 pills at bedtime for itching.  This will also help you to sleep a little better.  Return if not improving.   Scabies, Adult Scabies is a skin condition that happens when very small insects get under the skin (infestation). This causes a rash and severe itchiness. Scabies can spread from person to person (is contagious). If you get scabies, it is common for others in your household to get scabies too. With proper treatment, symptoms usually go away in 2-4 weeks. Scabies usually does not cause lasting problems. What are the causes? This condition is caused by mites (Sarcoptes scabiei, or human itch mites) that can only be seen with a microscope. The mites get into the top layer of skin and lay eggs. Scabies can spread from person to person through:  Close contact with a person who has scabies.  Contact with infested items, such as towels, bedding, or clothing.  What increases the risk? This condition is more likely to develop in:  People who live in nursing homes and other extended-care facilities.  People who have sexual contact with a partner who has scabies.  Young children who attend child care facilities.  People who care for others who are at increased risk for scabies.  What are the signs or symptoms? Symptoms of this condition may include:  Severe itchiness. This is often worse at night.  A rash that includes tiny red bumps or blisters. The rash commonly occurs on the wrist, elbow, armpit, fingers, waist, groin, or buttocks. Bumps may form a line (burrow) in some areas.  Skin irritation. This can include scaly patches or sores.  How is this diagnosed? This condition is diagnosed with a physical exam. Your health care provider will look closely at your  skin. In some cases, your health care provider may take a sample of your affected skin (skin scraping) and have it examined under a microscope. How is this treated? This condition may be treated with:  Medicated cream or lotion that kills the mites. This is spread on the entire body and left on for several hours. Usually, one treatment with medicated cream or lotion is enough to kill all of the mites. In severe cases, the treatment may be repeated.  Medicated cream that relieves itching.  Medicines that help to relieve itching.  Medicines that kill the mites. This treatment is rarely used.  Follow these instructions at home:  Medicines  Take or apply over-the-counter and prescription medicines as told by your health care provider.  Apply medicated cream or lotion as told by your health care provider.  Do not wash off the medicated cream or lotion until the necessary amount of time has passed. Skin Care  Avoid scratching your affected skin.  Keep your fingernails closely trimmed to reduce injury from scratching.  Take cool baths or apply cool washcloths to help reduce itching. General instructions  Clean all items that you recently had contact with, including bedding, clothing, and furniture. Do this on the same day that your treatment starts. ? Use hot water when you wash items. ? Place unwashable items into closed, airtight plastic bags for at least 3 days. The mites cannot live for more than 3 days away from human skin. ? Vacuum furniture and  mattresses that you use.  Make sure that other people who may have been infested are examined by a health care provider. These include members of your household and anyone who may have had contact with infested items.  Keep all follow-up visits as told by your health care provider. This is important. Contact a health care provider if:  You have itching that does not go away after 4 weeks of treatment.  You continue to develop new  bumps or burrows.  You have redness, swelling, or pain in your rash area after treatment.  You have fluid, blood, or pus coming from your rash. This information is not intended to replace advice given to you by your health care provider. Make sure you discuss any questions you have with your health care provider. Document Released: 11/10/2014 Document Revised: 07/28/2015 Document Reviewed: 09/21/2014 Elsevier Interactive Patient Education  [employer].    If you have lab work done today you will be contacted with your lab results within the next 2 weeks.  If you have not heard from [place] then please contact us. The fastest way to get your results is to register for My Chart.   IF you received an x-ray today, you will receive an invoice from [facility]. Please contact [facility] at [phone] with questions or concerns regarding your invoice.   IF you received labwork today, you will receive an invoice from [place]. Please contact [facility] at [phone] with questions or concerns regarding your invoice.   Our billing staff will not be able to assist you with questions regarding bills from these companies.  You will be contacted with the lab results as soon as they are available. The fastest way to get your results is to activate your My Chart account. Instructions are located on the last page of this paperwork. If you have not heard from [place] regarding the results in 2 weeks, please contact this office.

## 2017-12-06 ENCOUNTER — Ambulatory Visit: Payer: PRIVATE HEALTH INSURANCE | Admitting: Physician Assistant

## 2017-12-09 ENCOUNTER — Ambulatory Visit: Payer: PRIVATE HEALTH INSURANCE | Admitting: Family Medicine

## 2017-12-09 VITALS — BP 117/82 | HR 77 | Temp 98.2°F | Resp 16 | Ht 63.0 in | Wt 132.0 lb

## 2017-12-09 DIAGNOSIS — L299 Pruritus, unspecified: Secondary | ICD-10-CM | POA: Diagnosis not present

## 2017-12-09 DIAGNOSIS — R21 Rash and other nonspecific skin eruption: Secondary | ICD-10-CM | POA: Diagnosis not present

## 2017-12-09 DIAGNOSIS — B86 Scabies: Secondary | ICD-10-CM | POA: Diagnosis not present

## 2017-12-09 MED ORDER — METHYLPREDNISOLONE ACETATE 80 MG/ML IJ SUSP
80.0000 mg | Freq: Once | INTRAMUSCULAR | Status: AC
  Administered 2017-12-09: 80 mg via INTRAMUSCULAR

## 2017-12-09 NOTE — Progress Notes (Signed)
Patient ID: [name], female    DOB: 03-23-1990  Age: 27 y.o. MRN: [id number]  Chief Complaint  Patient presents with  . scabies    pt is not better, she is even worst    Subjective:   Patient was treated with ivermectin for the scabies.  That was 5 days ago.  She continues to itch.  She is very anxious about this.  Current allergies, medications, problem list, past/family and social histories reviewed.  Objective:  BP 117/82   Pulse 77   Temp 98.2 F (36.8 C) (Oral)   Resp 16   Ht 5\' 3"  (1.6 m)   Wt 132 lb (59.9 kg)   SpO2 98%   BMI 23.38 kg/m   Rash on forearms is improved.  Still has some rash between her scapula which looks more follicular than scabies they are.  Also has some on her thighs.  Assessment & Plan:   Assessment: 1. Rash and nonspecific skin eruption   2. Scabies   3. Itching       Plan: Partially treated scabies I think.  However will go ahead and make a dermatology appointment since symptoms continue to persist for her.  See instructions.  Give her some Depo-Medrol today to see if that will help get rid of the itching.  Cautioned her about sleep disturbance.  Orders Placed This Encounter  Procedures  . Ambulatory referral to Dermatology    Referral Priority:   Routine    Referral Type:   Consultation    Referral Reason:   Specialty Services Required    Requested Specialty:   Dermatology    Number of Visits Requested:   1    Meds ordered this encounter  Medications  . methylPREDNISolone acetate (DEPO-MEDROL) injection 80 mg         Patient Instructions   Itching from scabies can last for a fall despite having taken the ivermectin.  I think this will continue to resolve.  However, since the rash is not quite typical, I am referring you to dermatology for evaluation.  You should hear from that referral within the next several days, and if you do not hear by a late in the week call back to speak to the referrals desk.  Increase the  doxepin to 20 mg (2 tabs) at bedtime  Take Zyrtec (citirizine) one daily for itching also.  If you have lab work done today you will be contacted with your lab results within the next 2 weeks.  If you have not heard from [place] then please contact us. The fastest way to get your results is to register for My Chart.   IF you received an x-ray today, you will receive an invoice from [facility]. Please contact [facility] at [phone] with questions or concerns regarding your invoice.   IF you received labwork today, you will receive an invoice from [place]. Please contact [facility] at [phone] with questions or concerns regarding your invoice.   Our billing staff will not be able to assist you with questions regarding bills from these companies.  You will be contacted with the lab results as soon as they are available. The fastest way to get your results is to activate your My Chart account. Instructions are located on the last page of this paperwork. If you have not heard from [place] regarding the results in 2 weeks, please contact this office.         No follow-ups on file.   Janace Hoard, MD  12/09/2017 

## 2017-12-09 NOTE — Patient Instructions (Addendum)
Itching from scabies can last for a fall despite having taken the ivermectin.  I think this will continue to resolve.  However, since the rash is not quite typical, I am referring you to dermatology for evaluation.  You should hear from that referral within the next several days, and if you do not hear by a late in the week call back to speak to the referrals desk.  Increase the doxepin to 20 mg (2 tabs) at bedtime  Take Zyrtec (citirizine) one daily for itching also.  If you have lab work done today you will be contacted with your lab results within the next 2 weeks.  If you have not heard from [place] then please contact us. The fastest way to get your results is to register for My Chart.   IF you received an x-ray today, you will receive an invoice from [facility]. Please contact [facility] at [phone] with questions or concerns regarding your invoice.   IF you received labwork today, you will receive an invoice from [place]. Please contact [facility] at [phone] with questions or concerns regarding your invoice.   Our billing staff will not be able to assist you with questions regarding bills from these companies.  You will be contacted with the lab results as soon as they are available. The fastest way to get your results is to activate your My Chart account. Instructions are located on the last page of this paperwork. If you have not heard from [place] regarding the results in 2 weeks, please contact this office.

## 2018-02-18 ENCOUNTER — Other Ambulatory Visit: Payer: Self-pay

## 2018-02-18 ENCOUNTER — Encounter: Payer: Self-pay | Admitting: Family Medicine

## 2018-02-18 ENCOUNTER — Ambulatory Visit (INDEPENDENT_AMBULATORY_CARE_PROVIDER_SITE_OTHER): Payer: PRIVATE HEALTH INSURANCE | Admitting: Family Medicine

## 2018-02-18 VITALS — BP 118/75 | HR 76 | Temp 98.3°F | Ht 62.0 in | Wt 134.4 lb

## 2018-02-18 DIAGNOSIS — Z8619 Personal history of other infectious and parasitic diseases: Secondary | ICD-10-CM

## 2018-02-18 DIAGNOSIS — L299 Pruritus, unspecified: Secondary | ICD-10-CM

## 2018-02-18 MED ORDER — TRIAMCINOLONE ACETONIDE 0.1 % EX CREA: 1.0000 | TOPICAL_CREAM | Freq: Two times a day (BID) | CUTANEOUS | 0 refills | Status: DC

## 2018-02-18 MED ORDER — PERMETHRIN 5 % EX CREA: 1.0000 "application " | TOPICAL_CREAM | Freq: Once | CUTANEOUS | 0 refills | Status: AC

## 2018-02-18 NOTE — Patient Instructions (Addendum)
    I do not see any rash or signs of scabies at this time.  Itching could be due to dry skin or possibly reaction to the new lotion.  I would recommend switching back to Cetaphil, and see other information on dry skin care below.  For small itching areas, can also use the steroid cream that I prescribed today.  If you do notice a return of bumps or rash that was similar to previous scabies outbreak, I printed the prescription for scabies treatment.  Please let me know if there are questions.  Return to the clinic or go to the nearest emergency room if any of your symptoms worsen or new symptoms occur.  Drink at least 64 ounces of water daily. Consider a humidifier for the room where you sleep. Bathe once daily. Avoid using HOT water, as it dries skin.  Avoid deodorant soaps (Dial is the worst!) and stick with gentle cleansers (I like Cetaphil Liquid Cleanser). After bathing, dry off completely, then apply a thick emollient cream (I like Cetaphil Moisturizing Cream). Apply the cream twice daily, or more!    If you have lab work done today you will be contacted with your lab results within the next 2 weeks.  If you have not heard from us then please contact us. The fastest way to get your results is to register for My Chart.   IF you received an x-ray today, you will receive an invoice from [facility]. Please contact [facility] at [phone] with questions or concerns regarding your invoice.   IF you received labwork today, you will receive an invoice from [facility]. Please contact [facility] at [phone] with questions or concerns regarding your invoice.   Our billing staff will not be able to assist you with questions regarding bills from these companies.  You will be contacted with the lab results as soon as they are available. The fastest way to get your results is to activate your My Chart account. Instructions are located on the last page of this paperwork. If you  have not heard from us regarding the results in 2 weeks, please contact this office.

## 2018-02-18 NOTE — Progress Notes (Signed)
Subjective:    Patient ID: [name], female    DOB: [birth year], 27 y.o.   MRN: [id number]  HPI [name] is a 27 y.o. female Presents today for: Chief Complaint  Patient presents with  . Rash    1 week, maybe scabies     Started about 10 days ago.  Itching on forearms. Itching only initially. Some itching on abdomen, chest and back. Small bumps on chest wall.  No finger/hand rash.  No known sick contacts. 27yo dtr - healthy.  Rash is improving with treatment  no genital or mouth lesions.  No fever, does not feel sick.  New lotion and soap Ceravie 1 month ago. No other new detergents or derm products.   Scabies treated in September,october. Worried about itching that started recently could be same.   Tx: shea butter and tea tree oil - helps itch.    Patient Active Problem List   Diagnosis Date Noted  . Scabies 11/27/2017   Past Medical History:  Diagnosis Date  . Back pain   . GERD (gastroesophageal reflux disease)    Past Surgical History:  Procedure Laterality Date  . NO PAST SURGERIES     No Known Allergies Prior to Admission medications   Not on File   Social History   Socioeconomic History  . Marital status: Single    Spouse name: Not on file  . Number of children: 1  . Years of education: Not on file  . Highest education level: Not on file  Occupational History  . Not on file  Social Needs  . Financial resource strain: Not on file  . Food insecurity:    Worry: Not on file    Inability: Not on file  . Transportation needs:    Medical: Not on file    Non-medical: Not on file  Tobacco Use  . Smoking status: Never Smoker  . Smokeless tobacco: Never Used  Substance and Sexual Activity  . Alcohol use: No    Alcohol/week: 0.0 standard drinks  . Drug use: No  . Sexual activity: Yes    Birth control/protection: Implant  Lifestyle  . Physical activity:    Days per week: Not on file    Minutes per session: Not on file  . Stress: Not on file    Relationships  . Social connections:    Talks on phone: Not on file    Gets together: Not on file    Attends religious service: Not on file    Active member of club or organization: Not on file    Attends meetings of clubs or organizations: Not on file    Relationship status: Not on file  . Intimate partner violence:    Fear of current or ex partner: Not on file    Emotionally abused: Not on file    Physically abused: Not on file    Forced sexual activity: Not on file  Other Topics Concern  . Not on file  Social History Narrative  . Not on file    Review of Systems  Constitutional: Negative for fever.  Skin: Negative for rash (no current rash. ).       Itching.        Objective:   Physical Exam Constitutional:      General: She is not in acute distress.    Appearance: She is well-developed.  HENT:     Head: Normocephalic and atraumatic.     Mouth/Throat:     Mouth: Mucous  membranes are moist.     Pharynx: Oropharynx is clear.     Comments: No oral lesions identified. Cardiovascular:     Rate and Rhythm: Normal rate.  Pulmonary:     Effort: Pulmonary effort is normal.  Skin:    Comments: Assistant present in room for exam.  Forearms, hands without any papules or visible rash.  Slight dry skin.  Chest, abdomen, back also without any apparent rash.  Possible slight dry skin upper back with minimal excoriation.  Neurological:     Mental Status: She is alert and oriented to person, place, and time.    Vitals:   02/18/18 0954  BP: 118/75  Pulse: 76  Temp: 98.3 F (36.8 C)  TempSrc: Oral  SpO2: 100%  Weight: 134 lb 6.4 oz (61 kg)  Height: 5\' 2"  (1.575 m)       Assessment & Plan:   [name] is a 27 y.o. female Pruritus - Plan: triamcinolone cream (KENALOG) 0.1 %  History of scabies - Plan: permethrin (ELIMITE) 5 % cream  No rash seen on exam.  I do see that she was treated for scabies in September, October, but no sign at this time.  Possible dry skin  dermatitis versus contact dermatitis from new lotions started within the past month.  -Change back to Cetaphil, dry skin care reviewed with handout given  -Triamcinolone topical twice daily if needed for focal pruritic areas.  -Elimite cream sent in if she does notice any new bumps or rash similar to previous scabies outbreak, but can avoid use of that medication at this time.  -rtc precautions.  Meds ordered this encounter  Medications  . triamcinolone cream (KENALOG) 0.1 %    Sig: Apply 1 application topically 2 (two) times daily.    Dispense:  30 g    Refill:  0  . permethrin (ELIMITE) 5 % cream    Sig: Apply 1 application topically once for 1 dose. Rinse in 8 hours. Avoid eyes,nose, mouth.    Dispense:  60 g    Refill:  0   Patient Instructions      I do not see any rash or signs of scabies at this time.  Itching could be due to dry skin or possibly reaction to the new lotion.  I would recommend switching back to Cetaphil, and see other information on dry skin care below.  For small itching areas, can also use the steroid cream that I prescribed today.  If you do notice a return of bumps or rash that was similar to previous scabies outbreak, I printed the prescription for scabies treatment.  Please let me know if there are questions.  Return to the clinic or go to the nearest emergency room if any of your symptoms worsen or new symptoms occur.  Drink at least 64 ounces of water daily. Consider a humidifier for the room where you sleep. Bathe once daily. Avoid using HOT water, as it dries skin.  Avoid deodorant soaps (Dial is the worst!) and stick with gentle cleansers (I like Cetaphil Liquid Cleanser). After bathing, dry off completely, then apply a thick emollient cream (I like Cetaphil Moisturizing Cream). Apply the cream twice daily, or more!    If you have lab work done today you will be contacted with your lab results within the next 2 weeks.  If you have not heard from us  then please contact us. The fastest way to get your results is to register for My Chart.  IF you received an x-ray today, you will receive an invoice from [facility]. Please contact [facility] at [phone] with questions or concerns regarding your invoice.   IF you received labwork today, you will receive an invoice from [place]. Please contact [facility] at [phone] with questions or concerns regarding your invoice.   Our billing staff will not be able to assist you with questions regarding bills from these companies.  You will be contacted with the lab results as soon as they are available. The fastest way to get your results is to activate your My Chart account. Instructions are located on the last page of this paperwork. If you have not heard from [place] regarding the results in 2 weeks, please contact this office.       Signed,   Meredith Staggers, MD Primary Care at [facility].  02/18/18 10:37 AM

## 2018-02-24 ENCOUNTER — Encounter: Payer: Self-pay | Admitting: Family Medicine

## 2018-03-28 ENCOUNTER — Emergency Department (HOSPITAL_COMMUNITY)
Admission: EM | Admit: 2018-03-28 | Discharge: 2018-03-29 | Disposition: A | Discharge status: Home / Self Care | Payer: Self-pay | Attending: Emergency Medicine | Admitting: Emergency Medicine

## 2018-03-28 ENCOUNTER — Other Ambulatory Visit: Payer: Self-pay

## 2018-03-28 ENCOUNTER — Encounter (HOSPITAL_COMMUNITY): Payer: Self-pay | Admitting: Emergency Medicine

## 2018-03-28 DIAGNOSIS — L299 Pruritus, unspecified: Secondary | ICD-10-CM | POA: Insufficient documentation

## 2018-03-28 NOTE — ED Triage Notes (Signed)
C/o itching all over x 1 week.  Pt states she had scabies before and thinks she has them again.

## 2018-03-29 ENCOUNTER — Other Ambulatory Visit: Payer: Self-pay

## 2018-03-29 LAB — CBC WITH DIFFERENTIAL/PLATELET
ABS IMMATURE GRANULOCYTES: 0.02 10*3/uL (ref 0.00–0.07)
BASOS PCT: 0 %
Basophils Absolute: 0 10*3/uL (ref 0.0–0.1)
EOS PCT: 1 %
Eosinophils Absolute: 0.1 10*3/uL (ref 0.0–0.5)
HCT: 42.7 % (ref 36.0–46.0)
HEMOGLOBIN: 14.2 g/dL (ref 12.0–15.0)
Immature Granulocytes: 0 %
Lymphocytes Relative: 38 %
Lymphs Abs: 2.8 10*3/uL (ref 0.7–4.0)
MCH: 31.7 pg (ref 26.0–34.0)
MCHC: 33.3 g/dL (ref 30.0–36.0)
MCV: 95.3 fL (ref 80.0–100.0)
MONO ABS: 0.7 10*3/uL (ref 0.1–1.0)
MONOS PCT: 9 %
NEUTROS ABS: 3.8 10*3/uL (ref 1.7–7.7)
Neutrophils Relative %: 52 %
PLATELETS: 215 10*3/uL (ref 150–400)
RBC: 4.48 MIL/uL (ref 3.87–5.11)
RDW: 12.1 % (ref 11.5–15.5)
WBC: 7.3 10*3/uL (ref 4.0–10.5)
nRBC: 0 % (ref 0.0–0.2)

## 2018-03-29 LAB — HEPATIC FUNCTION PANEL
ALBUMIN: 4.5 g/dL (ref 3.5–5.0)
ALT: 17 U/L (ref 0–44)
AST: 23 U/L (ref 15–41)
Alkaline Phosphatase: 41 U/L (ref 38–126)
Bilirubin, Direct: 0.2 mg/dL (ref 0.0–0.2)
Indirect Bilirubin: 1.5 mg/dL — ABNORMAL HIGH (ref 0.3–0.9)
TOTAL PROTEIN: 7.6 g/dL (ref 6.5–8.1)
Total Bilirubin: 1.7 mg/dL — ABNORMAL HIGH (ref 0.3–1.2)

## 2018-03-29 MED ORDER — PERMETHRIN 5 % EX CREA: TOPICAL_CREAM | CUTANEOUS | 1 refills | Status: DC

## 2018-03-29 MED ORDER — HYDROXYZINE HCL 25 MG PO TABS: 25.0000 mg | ORAL_TABLET | Freq: Four times a day (QID) | ORAL | 0 refills | Status: DC | PRN

## 2018-03-29 MED ORDER — HYDROXYZINE HCL 25 MG PO TABS
25.0000 mg | ORAL_TABLET | Freq: Once | ORAL | Status: AC
  Administered 2018-03-29: 25 mg via ORAL
  Filled 2018-03-29: qty 1

## 2018-03-29 NOTE — ED Notes (Signed)
Patient verbalizes understanding of discharge instructions. Opportunity for questioning and answers were provided. Armband removed by staff, pt discharged from ED home via POV.  

## 2018-03-29 NOTE — ED Provider Notes (Signed)
[facility] EMERGENCY DEPARTMENT Provider Note   CSN: [id number] Arrival date & time: 03/28/18  2148     History   Chief Complaint Chief Complaint  Patient presents with  . Pruritis    ? scabies    HPI [name] is a 28 y.o. female.   28 year old female with no significant past medical history presents to the emergency department for evaluation of itching.  She states that she has been experiencing itching intermittently since October.  She has been seen at [facility] urgent care on multiple occasions.  States that she was initially treated with permethrin in October.  She felt as though it helped some, but symptoms returned in December and have remained constant and more significant.  Her pruritus is distractible and less of an issue for her when she is working or otherwise occupied.  She notices it mostly when at rest.  Itching affects her whole body, but she has a burning discomfort in her hands as well.  She has not had any bites or sores to her body.  Denies any fever, recent travel, new soaps, lotions, detergents, recent antibiotic use, initiation of any new medications.  Denies sleeping in any hotels or in a bed other than her own.  Denies any prior history of anxiety.     Past Medical History:  Diagnosis Date  . Back pain   . GERD (gastroesophageal reflux disease)     Patient Active Problem List   Diagnosis Date Noted  . Scabies 11/27/2017    Past Surgical History:  Procedure Laterality Date  . NO PAST SURGERIES       OB History    Gravida  2   Para  1   Term  1   Preterm  0   AB  0   Living  1     SAB  0   TAB  0   Ectopic  0   Multiple  0   Live Births  1            Home Medications    Prior to Admission medications   Medication Sig Start Date End Date Taking? Authorizing Provider  hydrOXYzine (ATARAX/VISTARIL) 25 MG tablet Take 1 tablet (25 mg total) by mouth every 6 (six) hours as needed for itching. 03/29/18    Antony Madura, PA-C  permethrin (ELIMITE) 5 % cream Apply to entire body other than face - let sit for 14 hours then wash off, may repeat in 1 week if still having symptoms 03/29/18   Antony Madura, PA-C  triamcinolone cream (KENALOG) 0.1 % Apply 1 application topically 2 (two) times daily. 02/18/18   Shade Flood, MD    Family History Family History  Problem Relation Age of Onset  . Hypertension Mother   . Anesthesia problems Neg Hx   . Hypotension Neg Hx   . Malignant hyperthermia Neg Hx   . Pseudochol deficiency Neg Hx   . Other Neg Hx     Social History Social History   Tobacco Use  . Smoking status: Never Smoker  . Smokeless tobacco: Never Used  Substance Use Topics  . Alcohol use: No    Alcohol/week: 0.0 standard drinks  . Drug use: No     Allergies   Patient has no known allergies.   Review of Systems Review of Systems Ten systems reviewed and are negative for acute change, except as noted in the HPI.    Physical Exam Updated Vital Signs BP 128/78 (  BP Location: Left Arm)   Pulse 84   Temp 98.2 F (36.8 C) (Oral)   Resp 17   SpO2 100%   Physical Exam Vitals signs and nursing note reviewed.  Constitutional:      General: She is not in acute distress.    Appearance: She is well-developed. She is not diaphoretic.     Comments: Nontoxic appearing and in NAD  HENT:     Head: Normocephalic and atraumatic.     Mouth/Throat:     Mouth: Mucous membranes are moist.     Comments: Oropharynx clear. No oral lesions. Eyes:     General: No scleral icterus.    Conjunctiva/sclera: Conjunctivae normal.  Neck:     Musculoskeletal: Normal range of motion.  Pulmonary:     Effort: Pulmonary effort is normal. No respiratory distress.     Breath sounds: No stridor. No wheezing.     Comments: Respirations even and unlabored Musculoskeletal: Normal range of motion.  Skin:    General: Skin is warm and dry.     Coloration: Skin is not pale.     Findings: No erythema  or rash.     Comments: No distinct rash to skin including trunk or extremities.  Neurological:     Mental Status: She is alert and oriented to person, place, and time.  Psychiatric:        Mood and Affect: Mood is anxious.        Speech: Speech is rapid and pressured.        Behavior: Behavior normal.     Comments: Patient tearful      ED Treatments / Results  Labs (all labs ordered are listed, but only abnormal results are displayed) Labs Reviewed  HEPATIC FUNCTION PANEL - Abnormal; Notable for the following components:      Result Value   Total Bilirubin 1.7 (*)    Indirect Bilirubin 1.5 (*)    All other components within normal limits  CBC WITH DIFFERENTIAL/PLATELET    EKG None  Radiology No results found.  Procedures Procedures (including critical care time)  Medications Ordered in ED Medications  hydrOXYzine (ATARAX/VISTARIL) tablet 25 mg (25 mg Oral Given 03/29/18 0050)     Initial Impression / Assessment and Plan / ED Course  I have reviewed the triage vital signs and the nursing notes.  Pertinent labs & imaging results that were available during my care of the patient were reviewed by me and considered in my medical decision making (see chart for details).      12:40 AM Patient with 3 months of intermittent, significant pruritus. Not as noticeable when working, but often aggravating to patient during times of rest. She has no bug bites or sores to her body. No urticarial rash. Will obtain labs to assess for possible polycythemia vera and cholestasis. Atarax ordered for symptoms.  1:07 AM CBC not concerning for PV. LFTs pending.  2:02 AM Patient reports some improvement with Atarax.  Her LFTs are reassuring without elevated alkaline phosphatase.  Do not suspect cholestasis.  Patient requesting discharge with permethrin in addition to Atarax.  Have discussed likely low yield for improvement with this cream.  Patient verbalizes understanding, but is  requesting a prescription for this regardless.  Have encouraged primary care follow-up.   Final Clinical Impressions(s) / ED Diagnoses   Final diagnoses:  Pruritus    ED Discharge Orders         Ordered    hydrOXYzine (ATARAX/VISTARIL) 25 MG tablet  Every 6 hours PRN     03/29/18 0205    permethrin (ELIMITE) 5 % cream     03/29/18 0205           Antony Madura, PA-C 03/29/18 0205    Derwood Kaplan, MD 03/29/18 [phone]

## 2018-03-31 ENCOUNTER — Ambulatory Visit: Payer: PRIVATE HEALTH INSURANCE | Admitting: Family Medicine

## 2019-04-15 LAB — HM PAP SMEAR: HM Pap smear: NORMAL

## 2024-02-18 ENCOUNTER — Encounter (HOSPITAL_COMMUNITY): Payer: Self-pay

## 2024-02-18 ENCOUNTER — Ambulatory Visit (HOSPITAL_COMMUNITY)
Admission: EM | Admit: 2024-02-18 | Discharge: 2024-02-18 | Disposition: A | Discharge status: Home / Self Care | Payer: PRIVATE HEALTH INSURANCE | Attending: Family Medicine | Admitting: Family Medicine

## 2024-02-18 DIAGNOSIS — L299 Pruritus, unspecified: Secondary | ICD-10-CM

## 2024-02-18 MED ORDER — HYDROXYZINE HCL 25 MG PO TABS: 25.0000 mg | ORAL_TABLET | Freq: Four times a day (QID) | ORAL | 0 refills | Status: AC

## 2024-02-18 MED ORDER — PERMETHRIN 5 % EX CREA: TOPICAL_CREAM | CUTANEOUS | 1 refills | Status: DC

## 2024-02-18 NOTE — ED Triage Notes (Signed)
 Pt c/o rash all over and in between fingers x2 days. States feels like scabies again. Took a benadryl  last night with no relief.

## 2024-02-18 NOTE — Discharge Instructions (Signed)
 You were seen today for itching.  I am treating you for possible scabies, similar to 2020.  I have sent out an oral medication for itching, and topical cream.  You may repeat the cream in 14 weeks if you still have intense itching.  Return if not improving.

## 2024-02-18 NOTE — ED Provider Notes (Signed)
 [facility]    CSN: [id number] Arrival date & time: 02/18/24  1335      History   Chief Complaint Chief Complaint  Patient presents with   Rash    HPI [name] is a 33 y.o. female.    Rash  Patient is here for scabies.  She states she had scabies in 2019, and this feels the same thing.  She has horrible itching to her ears, under her breasts, behind her knees and buttocks;  she has itching and redness in the webbing of her fingers.  Again, she states this feels the same way as before.  No other symptoms noted.  No contacts with similar symptoms.         Past Medical History:  Diagnosis Date   Back pain    GERD (gastroesophageal reflux disease)     Patient Active Problem List   Diagnosis Date Noted   Scabies 11/27/2017    Past Surgical History:  Procedure Laterality Date   NO PAST SURGERIES      OB History     Gravida  2   Para  1   Term  1   Preterm  0   AB  0   Living  1      SAB  0   IAB  0   Ectopic  0   Multiple  0   Live Births  1            Home Medications    Prior to Admission medications  Not on File    Family History Family History  Problem Relation Age of Onset   Hypertension Mother    Anesthesia problems Neg Hx    Hypotension Neg Hx    Malignant hyperthermia Neg Hx    Pseudochol deficiency Neg Hx    Other Neg Hx     Social History Social History[1]   Allergies   Patient has no known allergies.   Review of Systems Review of Systems  Constitutional: Negative.   HENT: Negative.    Respiratory: Negative.    Cardiovascular: Negative.   Gastrointestinal: Negative.   Skin:  Positive for rash.  Psychiatric/Behavioral: Negative.       Physical Exam Triage Vital Signs ED Triage Vitals  Encounter Vitals Group     BP 02/18/24 1530 123/85     Girls Systolic BP Percentile --      Girls Diastolic BP Percentile --      Boys Systolic BP Percentile --      Boys Diastolic BP Percentile  --      Pulse Rate 02/18/24 1530 79     Resp 02/18/24 1530 18     Temp 02/18/24 1530 98.4 F (36.9 C)     Temp Source 02/18/24 1530 Oral     SpO2 02/18/24 1530 98 %     Weight --      Height --      Head Circumference --      Peak Flow --      Pain Score 02/18/24 1527 0     Pain Loc --      Pain Education --      Exclude from Growth Chart --    No data found.  Updated Vital Signs BP 123/85 (BP Location: Left Arm)   Pulse 79   Temp 98.4 F (36.9 C) (Oral)   Resp 18   SpO2 98%   Visual Acuity Right Eye Distance:   Left Eye Distance:  Bilateral Distance:    Right Eye Near:   Left Eye Near:    Bilateral Near:     Physical Exam Constitutional:      General: She is not in acute distress.    Appearance: Normal appearance. She is normal weight. She is not ill-appearing or toxic-appearing.  Skin:    Comments: No obvious rash is noted;  areas of excoriation to the ears;   Neurological:     Mental Status: She is alert.      UC Treatments / Results  Labs (all labs ordered are listed, but only abnormal results are displayed) Labs Reviewed - No data to display  EKG   Radiology No results found.  Procedures Procedures (including critical care time)  Medications Ordered in UC Medications - No data to display  Initial Impression / Assessment and Plan / UC Course  I have reviewed the triage vital signs and the nursing notes.  Pertinent labs & imaging results that were available during my care of the patient were reviewed by me and considered in my medical decision making (see chart for details).    Final Clinical Impressions(s) / UC Diagnoses   Final diagnoses:  Pruritus     Discharge Instructions      You were seen today for itching.  I am treating you for possible scabies, similar to 2020.  I have sent out an oral medication for itching, and topical cream.  You may repeat the cream in 14 weeks if you still have intense itching.  Return if not  improving.     ED Prescriptions     Medication Sig Dispense Auth. Provider   hydrOXYzine  (ATARAX ) 25 MG tablet Take 1 tablet (25 mg total) by mouth every 6 (six) hours. 12 tablet Yariela Tison, MD   permethrin  (ELIMITE ) 5 % cream Apply to affected area once 60 g Darral Longs, MD      PDMP not reviewed this encounter.    [1]  Social History Tobacco Use   Smoking status: Never   Smokeless tobacco: Never  Vaping Use   Vaping status: Never Used  Substance Use Topics   Alcohol use: No    Alcohol/week: 0.0 standard drinks of alcohol   Drug use: No     Darral Longs, MD 02/18/24 1547

## 2024-03-02 ENCOUNTER — Encounter (HOSPITAL_COMMUNITY): Payer: Self-pay

## 2024-03-02 ENCOUNTER — Ambulatory Visit (HOSPITAL_COMMUNITY)
Admission: EM | Admit: 2024-03-02 | Discharge: 2024-03-02 | Disposition: A | Discharge status: Home / Self Care | Payer: PRIVATE HEALTH INSURANCE | Attending: Emergency Medicine | Admitting: Emergency Medicine

## 2024-03-02 DIAGNOSIS — R21 Rash and other nonspecific skin eruption: Secondary | ICD-10-CM

## 2024-03-02 DIAGNOSIS — L299 Pruritus, unspecified: Secondary | ICD-10-CM

## 2024-03-02 MED ORDER — PREDNISONE 20 MG PO TABS
ORAL_TABLET | ORAL | Status: AC
  Filled 2024-03-02: qty 2

## 2024-03-02 MED ORDER — CETIRIZINE HCL 10 MG PO TABS: 10.0000 mg | ORAL_TABLET | Freq: Every day | ORAL | 0 refills | Status: DC

## 2024-03-02 MED ORDER — PERMETHRIN 5 % EX CREA: TOPICAL_CREAM | CUTANEOUS | 0 refills | Status: AC

## 2024-03-02 MED ORDER — PREDNISONE 20 MG PO TABS
40.0000 mg | ORAL_TABLET | Freq: Once | ORAL | Status: AC
  Administered 2024-03-02: 40 mg via ORAL

## 2024-03-02 NOTE — ED Provider Notes (Signed)
 " [facility]    CSN: [id number] Arrival date & time: 03/02/24  0806      History   Chief Complaint Chief Complaint  Patient presents with   Rash    HPI Rhegan Spivey is a 33 y.o. female.   Patient presents to clinic over concern of continued itching.  She was seen here a few weeks ago on 12/16 and sent in permethrin .  Does have a history of scabies and this itching sensation was very similar.  She had also used the hydroxyzine  which made her fall asleep, but when she woke up she continued to have the itching.  She did use the permethrin  as prescribed but has not had any improvement in her symptoms.  Has a sensation that something is crawling under her skin.  Had her mother look at her back but her mother did not see any rash. Her dog's been itching a lot as well.  Reports her dog is up-to-date on flea tick and heartworm medication.   The history is provided by the patient and medical records.  Rash   Past Medical History:  Diagnosis Date   Back pain    GERD (gastroesophageal reflux disease)     Patient Active Problem List   Diagnosis Date Noted   Scabies 11/27/2017    Past Surgical History:  Procedure Laterality Date   NO PAST SURGERIES      OB History     Gravida  2   Para  1   Term  1   Preterm  0   AB  0   Living  1      SAB  0   IAB  0   Ectopic  0   Multiple  0   Live Births  1            Home Medications    Prior to Admission medications  Medication Sig Start Date End Date Taking? Authorizing Provider  cetirizine  (ZYRTEC ) 10 MG tablet Take 1 tablet (10 mg total) by mouth daily. 03/02/24  Yes Liahna Brickner  N, FNP  hydrOXYzine  (ATARAX ) 25 MG tablet Take 1 tablet (25 mg total) by mouth every 6 (six) hours. 02/18/24  Yes Piontek, Erin, MD  permethrin  (ELIMITE ) 5 % cream Apply from neck down to toes, under nails. Let sit for 8-14 hours then shower off. 03/02/24  Yes Dreama, Tahlor Berenguer  N, FNP    Family History Family  History  Problem Relation Age of Onset   Hypertension Mother    Anesthesia problems Neg Hx    Hypotension Neg Hx    Malignant hyperthermia Neg Hx    Pseudochol deficiency Neg Hx    Other Neg Hx     Social History Social History[1]   Allergies   Patient has no known allergies.   Review of Systems Review of Systems  Per HPI  Physical Exam Triage Vital Signs ED Triage Vitals [03/02/24 0831]  Encounter Vitals Group     BP 137/72     Girls Systolic BP Percentile      Girls Diastolic BP Percentile      Boys Systolic BP Percentile      Boys Diastolic BP Percentile      Pulse Rate 67     Resp 18     Temp 98.1 F (36.7 C)     Temp Source Oral     SpO2 98 %     Weight      Height  Head Circumference      Peak Flow      Pain Score      Pain Loc      Pain Education      Exclude from Growth Chart    No data found.  Updated Vital Signs BP 137/72 (BP Location: Left Arm)   Pulse 67   Temp 98.1 F (36.7 C) (Oral)   Resp 18   LMP 02/16/2024   SpO2 98%   Visual Acuity Right Eye Distance:   Left Eye Distance:   Bilateral Distance:    Right Eye Near:   Left Eye Near:    Bilateral Near:     Physical Exam Vitals and nursing note reviewed.  Constitutional:      Appearance: Normal appearance.  HENT:     Head: Normocephalic.     Right Ear: External ear normal.     Left Ear: External ear normal.     Nose: Nose normal.     Mouth/Throat:     Mouth: Mucous membranes are moist.  Eyes:     Conjunctiva/sclera: Conjunctivae normal.  Cardiovascular:     Rate and Rhythm: Normal rate.  Pulmonary:     Effort: Pulmonary effort is normal. No respiratory distress.  Musculoskeletal:        General: Normal range of motion.  Skin:    General: Skin is warm and dry.     Findings: Rash present. Rash is urticarial.      Neurological:     General: No focal deficit present.     Mental Status: She is alert.  Psychiatric:        Mood and Affect: Mood normal.      UC  Treatments / Results  Labs (all labs ordered are listed, but only abnormal results are displayed) Labs Reviewed - No data to display  EKG   Radiology No results found.  Procedures Procedures (including critical care time)  Medications Ordered in UC Medications  predniSONE  (DELTASONE ) tablet 40 mg (has no administration in time range)    Initial Impression / Assessment and Plan / UC Course  I have reviewed the triage vital signs and the nursing notes.  Pertinent labs & imaging results that were available during my care of the patient were reviewed by me and considered in my medical decision making (see chart for details).  Vitals and triage reviewed, patient is hemodynamically stable.  Erythematous maculopapular rash to the left inner elbow.  Diffuse pruritus.  Will retreat with permethrin .  Encouraged treatment of the dog as well, as this may be a factor.  Discussed itching after scabies can last for over a month, symptomatic management reviewed.  Plan of care, follow-up care and return precautions given, no questions at this time.    Final Clinical Impressions(s) / UC Diagnoses   Final diagnoses:  Rash and nonspecific skin eruption  Pruritus     Discharge Instructions      Massage permethrin  thoroughly into the skin from the neck down to the soles of your feet including under your fingernails and toenails.  Let this sit for 8 to 14 hours, usually overnight and then wash it off with a shower bath in the morning.  Itching may persist for up to a month after scabies treatment, use cetirizine  daily for itching.  Dogs can absolutely get scabies.  They need prescription antiparasitic medications such as Nexguard or Revolution. Follow-up with a veterinarian for your dog.  If your dog has scabies, can easily spread to  humans so treating her dog is important.       ED Prescriptions     Medication Sig Dispense Auth. Provider   cetirizine  (ZYRTEC ) 10 MG tablet Take 1  tablet (10 mg total) by mouth daily. 30 tablet Dreama, Teruo Stilley  N, FNP   permethrin  (ELIMITE ) 5 % cream Apply from neck down to toes, under nails. Let sit for 8-14 hours then shower off. 60 g Dreama, Analiya Porco  N, FNP      PDMP not reviewed this encounter.     [1]  Social History Tobacco Use   Smoking status: Never   Smokeless tobacco: Never  Vaping Use   Vaping status: Never Used  Substance Use Topics   Alcohol use: No    Alcohol/week: 0.0 standard drinks of alcohol   Drug use: No     Dreama Sarah Baez  N, FNP 03/02/24 [phone]  "

## 2024-03-02 NOTE — ED Triage Notes (Signed)
 Pt c/o rash all over her body. She has been using Benadryl  as needed with no relief. Patient states she has scabies.

## 2024-03-02 NOTE — Discharge Instructions (Addendum)
 Massage permethrin  thoroughly into the skin from the neck down to the soles of your feet including under your fingernails and toenails.  Let this sit for 8 to 14 hours, usually overnight and then wash it off with a shower bath in the morning.  Itching may persist for up to a month after scabies treatment, use cetirizine  daily for itching.  Dogs can absolutely get scabies.  They need prescription antiparasitic medications such as Nexguard or Revolution. Follow-up with a veterinarian for your dog.  If your dog has scabies, can easily spread to humans so treating her dog is important.

## 2024-04-03 ENCOUNTER — Ambulatory Visit (HOSPITAL_COMMUNITY)
Admission: EM | Admit: 2024-04-03 | Discharge: 2024-04-03 | Disposition: A | Discharge status: Home / Self Care | Payer: PRIVATE HEALTH INSURANCE | Attending: Emergency Medicine | Admitting: Emergency Medicine

## 2024-04-03 ENCOUNTER — Encounter (HOSPITAL_COMMUNITY): Payer: Self-pay

## 2024-04-03 DIAGNOSIS — R21 Rash and other nonspecific skin eruption: Secondary | ICD-10-CM

## 2024-04-03 DIAGNOSIS — L299 Pruritus, unspecified: Secondary | ICD-10-CM

## 2024-04-03 MED ORDER — TRIAMCINOLONE ACETONIDE 0.1 % EX CREA: 1.0000 | TOPICAL_CREAM | Freq: Two times a day (BID) | CUTANEOUS | 0 refills | Status: AC

## 2024-04-03 MED ORDER — CETIRIZINE HCL 10 MG PO TABS: 10.0000 mg | ORAL_TABLET | Freq: Every day | ORAL | 0 refills | Status: AC

## 2024-04-03 MED ORDER — PREDNISONE 20 MG PO TABS: 40.0000 mg | ORAL_TABLET | Freq: Every day | ORAL | 0 refills | Status: AC

## 2024-04-03 NOTE — Discharge Instructions (Addendum)
 Start taking 2 tablets of prednisone  once daily for 3 days to help with itching and rash. You can apply triamcinolone  cream to the rash as well. Take cetirizine  (Zyrtec ) once daily to help with itching and rash. Stop applying permethrin  cream as this does not appear to be scabies and therefore is not going to help with your symptoms at this time. I have attached information for an allergy specialist as well as dermatology to follow-up with if your symptoms continue for further evaluation. Otherwise follow-up with your primary care provider or return here as needed.

## 2024-04-03 NOTE — ED Triage Notes (Signed)
 Patient states she has a rash similar to scabies. Patient states currently being treated for scabies but sharing her cream with her daughter so her symptoms are no improving.   Onset of rash and itching 2 days ago.

## 2024-04-15 ENCOUNTER — Ambulatory Visit: Payer: PRIVATE HEALTH INSURANCE | Admitting: Internal Medicine
